# Patient Record
Sex: Female | Born: 1935 | Race: White | Hispanic: No | Marital: Married | Smoking: Never smoker
Health system: Southern US, Community
[De-identification: ages and names within clinical notes are randomized; demographics above are authoritative.]

## PROBLEM LIST (undated history)

## (undated) DIAGNOSIS — G8929 Other chronic pain: Secondary | ICD-10-CM

## (undated) DIAGNOSIS — G309 Alzheimer's disease, unspecified: Secondary | ICD-10-CM

## (undated) DIAGNOSIS — M549 Dorsalgia, unspecified: Secondary | ICD-10-CM

## (undated) DIAGNOSIS — F0280 Dementia in other diseases classified elsewhere without behavioral disturbance: Secondary | ICD-10-CM

## (undated) HISTORY — PX: ABDOMINAL HYSTERECTOMY: SHX81

## (undated) HISTORY — PX: CHOLECYSTECTOMY: SHX55

---

## 1998-08-02 ENCOUNTER — Encounter: Payer: Self-pay | Admitting: Emergency Medicine

## 1998-08-02 ENCOUNTER — Emergency Department (HOSPITAL_COMMUNITY): Admission: EM | Admit: 1998-08-02 | Discharge: 1998-08-02 | Payer: Self-pay | Admitting: Emergency Medicine

## 2016-02-02 DIAGNOSIS — E785 Hyperlipidemia, unspecified: Secondary | ICD-10-CM | POA: Diagnosis present

## 2017-08-16 ENCOUNTER — Emergency Department (HOSPITAL_BASED_OUTPATIENT_CLINIC_OR_DEPARTMENT_OTHER)
Admission: EM | Admit: 2017-08-16 | Discharge: 2017-08-16 | Disposition: A | Discharge status: Home / Self Care | Payer: Medicare Other | Attending: Emergency Medicine | Admitting: Emergency Medicine

## 2017-08-16 ENCOUNTER — Encounter (HOSPITAL_BASED_OUTPATIENT_CLINIC_OR_DEPARTMENT_OTHER): Payer: Self-pay | Admitting: Emergency Medicine

## 2017-08-16 ENCOUNTER — Other Ambulatory Visit: Payer: Self-pay

## 2017-08-16 ENCOUNTER — Emergency Department (HOSPITAL_BASED_OUTPATIENT_CLINIC_OR_DEPARTMENT_OTHER): Payer: Medicare Other

## 2017-08-16 DIAGNOSIS — G309 Alzheimer's disease, unspecified: Secondary | ICD-10-CM | POA: Insufficient documentation

## 2017-08-16 DIAGNOSIS — F0281 Dementia in other diseases classified elsewhere with behavioral disturbance: Secondary | ICD-10-CM | POA: Diagnosis not present

## 2017-08-16 DIAGNOSIS — R4182 Altered mental status, unspecified: Secondary | ICD-10-CM | POA: Diagnosis present

## 2017-08-16 HISTORY — DX: Dementia in other diseases classified elsewhere, unspecified severity, without behavioral disturbance, psychotic disturbance, mood disturbance, and anxiety: F02.80

## 2017-08-16 HISTORY — DX: Dorsalgia, unspecified: M54.9

## 2017-08-16 HISTORY — DX: Other chronic pain: G89.29

## 2017-08-16 HISTORY — DX: Alzheimer's disease, unspecified: G30.9

## 2017-08-16 LAB — URINALYSIS, ROUTINE W REFLEX MICROSCOPIC
Bilirubin Urine: NEGATIVE
Glucose, UA: NEGATIVE mg/dL
Hgb urine dipstick: NEGATIVE
Ketones, ur: NEGATIVE mg/dL
Nitrite: NEGATIVE
Protein, ur: NEGATIVE mg/dL
Specific Gravity, Urine: 1.015 (ref 1.005–1.030)
pH: 7 (ref 5.0–8.0)

## 2017-08-16 LAB — CBC WITH DIFFERENTIAL/PLATELET
Basophils Absolute: 0 10*3/uL (ref 0.0–0.1)
Basophils Relative: 0 %
Eosinophils Absolute: 0.1 10*3/uL (ref 0.0–0.7)
Eosinophils Relative: 1 %
HCT: 38.8 % (ref 36.0–46.0)
Hemoglobin: 13 g/dL (ref 12.0–15.0)
Lymphocytes Relative: 32 %
Lymphs Abs: 3.2 10*3/uL (ref 0.7–4.0)
MCH: 31 pg (ref 26.0–34.0)
MCHC: 33.5 g/dL (ref 30.0–36.0)
MCV: 92.4 fL (ref 78.0–100.0)
Monocytes Absolute: 0.9 10*3/uL (ref 0.1–1.0)
Monocytes Relative: 9 %
Neutro Abs: 6 10*3/uL (ref 1.7–7.7)
Neutrophils Relative %: 58 %
Platelets: 231 10*3/uL (ref 150–400)
RBC: 4.2 MIL/uL (ref 3.87–5.11)
RDW: 12 % (ref 11.5–15.5)
WBC: 10.3 10*3/uL (ref 4.0–10.5)

## 2017-08-16 LAB — TROPONIN I: Troponin I: <0.03 ng/mL (ref <0.03)

## 2017-08-16 LAB — URINALYSIS, MICROSCOPIC (REFLEX)

## 2017-08-16 LAB — COMPREHENSIVE METABOLIC PANEL
ALT: 14 U/L (ref 14–54)
AST: 18 U/L (ref 15–41)
Albumin: 3.6 g/dL (ref 3.5–5.0)
Alkaline Phosphatase: 93 U/L (ref 38–126)
Anion gap: 10 (ref 5–15)
BUN: 18 mg/dL (ref 6–20)
CO2: 26 mmol/L (ref 22–32)
Calcium: 8.9 mg/dL (ref 8.9–10.3)
Chloride: 103 mmol/L (ref 101–111)
Creatinine, Ser: 0.57 mg/dL (ref 0.44–1.00)
GFR calc Af Amer: >60 mL/min (ref >60)
GFR calc non Af Amer: >60 mL/min (ref >60)
Glucose, Bld: 95 mg/dL (ref 65–99)
Potassium: 3.5 mmol/L (ref 3.5–5.1)
Sodium: 139 mmol/L (ref 135–145)
Total Bilirubin: 0.4 mg/dL (ref 0.3–1.2)
Total Protein: 6.9 g/dL (ref 6.5–8.1)

## 2017-08-16 NOTE — ED Triage Notes (Signed)
Patient is brought in by family because she is a resident of [facility] and the patient has been becoming more and more confused today. The daughter states that she had a urine checked this am but [facility] wanted the patient to be evaluated further for a UTI vs progression of dementia

## 2017-08-16 NOTE — ED Provider Notes (Signed)
[facility] EMERGENCY DEPARTMENT Provider Note   CSN: [id number] Arrival date & time: 08/16/17  1935     History   Chief Complaint Chief Complaint  Patient presents with  . Altered Mental Status    HPI Melinda Love is a 84 y.o. female.  Patient is a 84 year old female with a history of dementia but no other significant medical problems.  Presenting today with family member for 2 weeks of worsening behavior.  Family member states until 2 weeks ago she had been doing really well she did in the past have issues with being paranoid but she had been doing well but in the last 2 weeks she has called the police multiple times on her daughter stating that she stole her car and her money.  She has been extremely agitated and paranoid.  After a week of this behavior she saw her psychiatrist and they started her on Depakote and Ativan which she has been taking almost daily as they are as needed meds.  She is still sleeping at night without any difficulty.  She is eating, drinking and denies chest pain, shortness of breath or abdominal pain.  Staff noticed may be some slight weakness when attempting to stand and seemed a little bit slower with walking but patient denies any fatigue or pain.  tHey did a urine today at her skilled facility however they would not get results back till Monday and they did not want her to wait all weekend.  Patient has no complaints when asked   The history is provided by a relative.  Altered Mental Status   This is a new problem. Episode onset: 2 weeks. The problem has not changed since onset.Associated symptoms include agitation. Her past medical history is significant for dementia.    Past Medical History:  Diagnosis Date  . Alzheimer disease   . Chronic back pain     There are no active problems to display for this patient.   Past Surgical History:  Procedure Laterality Date  . ABDOMINAL HYSTERECTOMY    . CHOLECYSTECTOMY      OB History    No  data available       Home Medications    Prior to Admission medications   Not on File    Family History History reviewed. No pertinent family history.  Social History Social History   Tobacco Use  . Smoking status: Never Smoker  . Smokeless tobacco: Never Used  Substance Use Topics  . Alcohol use: No    Frequency: Never  . Drug use: No     Allergies   Patient has no known allergies.   Review of Systems Review of Systems  Psychiatric/Behavioral: Positive for agitation.  All other systems reviewed and are negative.    Physical Exam Updated Vital Signs BP (!) 135/59 (BP Location: Left Arm)   Pulse 62   Temp 98.6 F (37 C) (Oral)   Resp 18   Wt 63.1 kg (139 lb 3.2 oz)   SpO2 98%   Physical Exam  Constitutional: She appears well-developed and well-nourished. No distress.  HENT:  Head: Normocephalic and atraumatic.  Mouth/Throat: Oropharynx is clear and moist.  Eyes: Conjunctivae and EOM are normal. Pupils are equal, round, and reactive to light.  Neck: Normal range of motion. Neck supple.  Cardiovascular: Normal rate, regular rhythm and intact distal pulses.  No murmur heard. Pulmonary/Chest: Effort normal and breath sounds normal. No respiratory distress. She has no wheezes. She has no rales.  Abdominal:  Soft. She exhibits no distension. There is no tenderness. There is no rebound and no guarding.  Musculoskeletal: Normal range of motion. She exhibits no edema or tenderness.  Neurological: She is alert.  Oriented to person and place.  Pt intermittently rubbing hands together but will pay attention and answer questions.  Gait wnl.  Normal strength, sensation and CN.  No aphasia  Skin: Skin is warm and dry. No rash noted. No erythema.  Psychiatric:  Patient is calm and cooperative on exam.  She does not appear to be having any hallucinations.  Currently is not displaying any paranoid behavior however based on daughter's report has problems with this.    Nursing note and vitals reviewed.    ED Treatments / Results  Labs (all labs ordered are listed, but only abnormal results are displayed) Labs Reviewed  URINALYSIS, ROUTINE W REFLEX MICROSCOPIC - Abnormal; Notable for the following components:      Result Value   APPearance CLOUDY (*)    Leukocytes, UA SMALL (*)    All other components within normal limits  URINALYSIS, MICROSCOPIC (REFLEX) - Abnormal; Notable for the following components:   Bacteria, UA MANY (*)    Squamous Epithelial / LPF 6-30 (*)    All other components within normal limits  CBC WITH DIFFERENTIAL/PLATELET  COMPREHENSIVE METABOLIC PANEL  TROPONIN I    EKG  EKG Interpretation  Date/Time:  Friday August 16 2017 21:17:11 EDT Ventricular Rate:  55 PR Interval:    QRS Duration: 137 QT Interval:  502 QTC Calculation: 481 R Axis:   99 Text Interpretation:  Sinus rhythm Nonspecific intraventricular conduction delay Probable anteroseptal infarct, recent No previous tracing Confirmed by Gwyneth SproutPlunkett, Juana Montini ([id number]) on 08/16/2017 10:14:00 PM       Radiology Ct Head Wo Contrast  Result Date: 08/16/2017 CLINICAL DATA:  Altered mental status. Alzheimer's dementia. No reported injury. EXAM: CT HEAD WITHOUT CONTRAST TECHNIQUE: Contiguous axial images were obtained from the base of the skull through the vertex without intravenous contrast. COMPARISON:  None. FINDINGS: Brain: No evidence of parenchymal hemorrhage or extra-axial fluid collection. No mass lesion, mass effect, or midline shift. No CT evidence of acute infarction. Nonspecific moderate subcortical and periventricular white matter hypodensity, most in keeping with chronic small vessel ischemic change. Generalized cerebral volume loss. No ventriculomegaly. Vascular: No acute abnormality. Skull: No evidence of calvarial fracture. Sinuses/Orbits: The visualized paranasal sinuses are essentially clear. Other:  Small partial bilateral inferior mastoid effusions.  IMPRESSION: 1.  No evidence of acute intracranial abnormality. 2. Generalized cerebral volume loss and moderate chronic small vessel ischemic changes in the cerebral white matter. 3. Nonspecific small partial bilateral mastoid effusions. Electronically Signed   By: Delbert PhenixJason A Poff M.D.   On: 08/16/2017 21:59    Procedures Procedures (including critical care time)  Medications Ordered in ED Medications - No data to display   Initial Impression / Assessment and Plan / ED Course  I have reviewed the triage vital signs and the nursing notes.  Pertinent labs & imaging results that were available during my care of the patient were reviewed by me and considered in my medical decision making (see chart for details).     Elderly female presenting today with change in mental status for the last 2 weeks.  Family initially thought this is just exacerbation of her dementia she saw her psychiatrist and had Depakote and Ativan started.  However patient's symptoms are not getting better and they started to look for a medical cause for her  symptoms.  Patient is in no acute distress on exam.  She has no complaints.  Vital signs are reassuring.  Will ensure no electrolyte abnormality, silent MI, or renal problems.  We will also look for UTI.  Also will ensure no acute intracranial process.  Patient had no medication changes prior to her symptoms. CBC, CMP, troponin, EKG, UA, head CT pending.  10:36 PM All labs are reassuring.  UA without obvious UTI.  Head CT and EKG without acute findings.  At this time feel more likely this is exacerbation of her dementia.  They will follow-up with her psychiatrist on Monday if she continues to have the symptoms for adjustment in her Depakote level.  Final Clinical Impressions(s) / ED Diagnoses   Final diagnoses:  Alzheimer's dementia with behavioral disturbance, unspecified timing of dementia onset    ED Discharge Orders    None       Gwyneth Sprout, MD 08/16/17  2237

## 2017-10-07 ENCOUNTER — Emergency Department (HOSPITAL_BASED_OUTPATIENT_CLINIC_OR_DEPARTMENT_OTHER): Payer: Medicare Other

## 2017-10-07 ENCOUNTER — Other Ambulatory Visit: Payer: Self-pay

## 2017-10-07 ENCOUNTER — Emergency Department (HOSPITAL_BASED_OUTPATIENT_CLINIC_OR_DEPARTMENT_OTHER)
Admission: EM | Admit: 2017-10-07 | Discharge: 2017-10-07 | Disposition: A | Discharge status: Home / Self Care | Payer: Medicare Other | Attending: Emergency Medicine | Admitting: Emergency Medicine

## 2017-10-07 ENCOUNTER — Encounter (HOSPITAL_BASED_OUTPATIENT_CLINIC_OR_DEPARTMENT_OTHER): Payer: Self-pay | Admitting: *Deleted

## 2017-10-07 DIAGNOSIS — R05 Cough: Secondary | ICD-10-CM | POA: Insufficient documentation

## 2017-10-07 DIAGNOSIS — R059 Cough, unspecified: Secondary | ICD-10-CM

## 2017-10-07 DIAGNOSIS — G309 Alzheimer's disease, unspecified: Secondary | ICD-10-CM | POA: Insufficient documentation

## 2017-10-07 MED ORDER — GUAIFENESIN 100 MG/5ML PO SYRP: 100.0000 mg | ORAL_SOLUTION | ORAL | 0 refills | Status: DC | PRN

## 2017-10-07 NOTE — ED Provider Notes (Signed)
[facility] EMERGENCY DEPARTMENT Provider Note   CSN: [id number] Arrival date & time: 10/07/17  1816     History   Chief Complaint Chief Complaint  Patient presents with  . Cough  . Chills    HPI Melinda Love is a 84 y.o. female.  HPI  84 year old female with a history of Alzheimer's presents with cough.  History is taken from patient and daughter.  Daughter states the facility told her the cough started today around noon.  Patient states she had a cough yesterday that was milder as well as low-grade fever of 99.  However both deny that she is had fevers over 100, vomiting, shortness of breath, or pain such as chest pain.  The cough is nonproductive.  Past Medical History:  Diagnosis Date  . Alzheimer disease   . Chronic back pain     There are no active problems to display for this patient.   Past Surgical History:  Procedure Laterality Date  . ABDOMINAL HYSTERECTOMY    . CHOLECYSTECTOMY       OB History   None      Home Medications    Prior to Admission medications   Medication Sig Start Date End Date Taking? Authorizing Provider  guaifenesin (ROBITUSSIN) 100 MG/5ML syrup Take 5-10 mLs (100-200 mg total) by mouth every 4 (four) hours as needed for cough. 10/07/17   Pricilla Loveless, MD    Family History No family history on file.  Social History Social History   Tobacco Use  . Smoking status: Never Smoker  . Smokeless tobacco: Never Used  Substance Use Topics  . Alcohol use: No    Frequency: Never  . Drug use: No     Allergies   Patient has no known allergies.   Review of Systems Review of Systems  Constitutional: Negative for fever.  Respiratory: Positive for cough. Negative for shortness of breath.   Cardiovascular: Negative for chest pain.  Gastrointestinal: Negative for vomiting.     Physical Exam Updated Vital Signs BP (!) 159/66   Pulse 74   Temp 98.5 F (36.9 C)   Resp 20   Ht  (1.651 m)   Wt 63 kg (139 lb)    SpO2 95%   BMI 23.13 kg/m   Physical Exam  Constitutional: She appears well-developed and well-nourished. No distress.  HENT:  Head: Normocephalic and atraumatic.  Right Ear: External ear normal.  Left Ear: External ear normal.  Nose: Nose normal.  Eyes: Right eye exhibits no discharge. Left eye exhibits no discharge.  Cardiovascular: Normal rate, regular rhythm and normal heart sounds.  Pulmonary/Chest: Effort normal and breath sounds normal. No respiratory distress. She has no rales.  Abdominal: Soft. She exhibits no distension. There is no tenderness.  Neurological: She is alert.  Skin: Skin is warm and dry. She is not diaphoretic.  Nursing note and vitals reviewed.    ED Treatments / Results  Labs (all labs ordered are listed, but only abnormal results are displayed) Labs Reviewed - No data to display  EKG None  Radiology Dg Chest 2 View  Result Date: 10/07/2017 CLINICAL DATA:  Pt c/o cough, fever, and chills since earlier today. EXAM: CHEST - 2 VIEW COMPARISON:  None. FINDINGS: Cardiac silhouette is normal in size. No mediastinal or hilar masses. No evidence of adenopathy. There are reticular opacities in the anterior lung bases consistent with scarring. Lungs are otherwise clear. No pleural effusion or pneumothorax. Skeletal structures are demineralized. A tack in the  right humeral head is consistent with prior rotator cuff surgery. IMPRESSION: No acute cardiopulmonary disease. Electronically Signed   By: Amie Portland M.D.   On: 10/07/2017 21:50    Procedures Procedures (including critical care time)  Medications Ordered in ED Medications - No data to display   Initial Impression / Assessment and Plan / ED Course  I have reviewed the triage vital signs and the nursing notes.  Pertinent labs & imaging results that were available during my care of the patient were reviewed by me and considered in my medical decision making (see chart for details).     Patient's  chest x-ray is benign.  She has intermittent scattered wheezes that resolve on their own with repeated inspiration.  No obvious pneumonia.  Given she is afebrile, not short of breath, no hypoxia, I doubt pneumonia and this is very early.  This could develop into pneumonia and daughter was made aware of this.  However at this point I would treat cough and just have other generalized supportive care.  She appears stable for discharge back to her facility with return precautions.  Final Clinical Impressions(s) / ED Diagnoses   Final diagnoses:  Cough    ED Discharge Orders        Ordered    guaifenesin (ROBITUSSIN) 100 MG/5ML syrup  Every 4 hours PRN     10/07/17 2256       Pricilla Loveless, MD 10/07/17 2341

## 2017-10-07 NOTE — ED Notes (Signed)
ED Provider at bedside. 

## 2017-10-07 NOTE — ED Triage Notes (Signed)
Cough, low grade fever and chills. She lives at [facility]. Daughter states she had an update today at 12pm and she was doing great.

## 2017-10-07 NOTE — Discharge Instructions (Signed)
See your doctor or return to the ER if you develop worsening cough such as having sputum or if you have fever, shortness of breath, chest pain, or other new/concerning symptoms.

## 2019-11-22 ENCOUNTER — Encounter (HOSPITAL_BASED_OUTPATIENT_CLINIC_OR_DEPARTMENT_OTHER): Payer: Self-pay | Admitting: Emergency Medicine

## 2019-11-22 ENCOUNTER — Other Ambulatory Visit: Payer: Self-pay

## 2019-11-22 ENCOUNTER — Emergency Department (HOSPITAL_BASED_OUTPATIENT_CLINIC_OR_DEPARTMENT_OTHER): Payer: Medicare Other

## 2019-11-22 ENCOUNTER — Emergency Department (HOSPITAL_BASED_OUTPATIENT_CLINIC_OR_DEPARTMENT_OTHER)
Admission: EM | Admit: 2019-11-22 | Discharge: 2019-11-22 | Disposition: A | Discharge status: Home / Self Care | Payer: Medicare Other | Attending: Emergency Medicine | Admitting: Emergency Medicine

## 2019-11-22 DIAGNOSIS — Y92128 Other place in nursing home as the place of occurrence of the external cause: Secondary | ICD-10-CM | POA: Insufficient documentation

## 2019-11-22 DIAGNOSIS — F0390 Unspecified dementia without behavioral disturbance: Secondary | ICD-10-CM | POA: Diagnosis not present

## 2019-11-22 DIAGNOSIS — W19XXXA Unspecified fall, initial encounter: Secondary | ICD-10-CM

## 2019-11-22 DIAGNOSIS — M25561 Pain in right knee: Secondary | ICD-10-CM | POA: Insufficient documentation

## 2019-11-22 DIAGNOSIS — S60221A Contusion of right hand, initial encounter: Secondary | ICD-10-CM | POA: Insufficient documentation

## 2019-11-22 DIAGNOSIS — W1839XA Other fall on same level, initial encounter: Secondary | ICD-10-CM | POA: Insufficient documentation

## 2019-11-22 DIAGNOSIS — Y999 Unspecified external cause status: Secondary | ICD-10-CM | POA: Insufficient documentation

## 2019-11-22 DIAGNOSIS — S60222A Contusion of left hand, initial encounter: Secondary | ICD-10-CM | POA: Insufficient documentation

## 2019-11-22 DIAGNOSIS — Y9389 Activity, other specified: Secondary | ICD-10-CM | POA: Diagnosis not present

## 2019-11-22 DIAGNOSIS — S0003XA Contusion of scalp, initial encounter: Secondary | ICD-10-CM | POA: Diagnosis present

## 2019-11-22 LAB — CBG MONITORING, ED: Glucose-Capillary: 113 mg/dL — ABNORMAL HIGH (ref 70–99)

## 2019-11-22 MED ORDER — SODIUM CHLORIDE 0.9 % IV SOLN: 1.0000 g | Freq: Once | INTRAVENOUS | Status: DC

## 2019-11-22 MED ORDER — ACETAMINOPHEN 325 MG PO TABS
650.0000 mg | ORAL_TABLET | Freq: Once | ORAL | Status: AC
  Administered 2019-11-22: 650 mg via ORAL
  Filled 2019-11-22: qty 2

## 2019-11-22 NOTE — ED Provider Notes (Signed)
[facility] EMERGENCY DEPARTMENT Provider Note   CSN: [id number] Arrival date & time: 11/22/19  1851  LEVEL 5 CAVEAT - DEMENTIA  History Chief Complaint  Patient presents with  . Fall    Melinda Love is a 86 y.o. female.  HPI 86 year old female presents after a fall at her nursing facility.  Has an occipital hematoma.  Per EMS she fell twice.  The patient was complaining of bilateral hip pain at the facility.  Otherwise has a history of frequent falls.  Further history is limited by her dementia.  Past Medical History:  Diagnosis Date  . Alzheimer disease (HCC)   . Chronic back pain     There are no problems to display for this patient.   Past Surgical History:  Procedure Laterality Date  . ABDOMINAL HYSTERECTOMY    . CHOLECYSTECTOMY       OB History   No obstetric history on file.     No family history on file.  Social History   Tobacco Use  . Smoking status: Never Smoker  . Smokeless tobacco: Never Used  Substance Use Topics  . Alcohol use: No  . Drug use: No    Home Medications Prior to Admission medications   Medication Sig Start Date End Date Taking? Authorizing Provider  acetaminophen (TYLENOL) 325 MG tablet Take 650 mg by mouth every 6 (six) hours as needed.   Yes [provider]  busPIRone (BUSPAR) 10 MG tablet Take 10 mg by mouth 3 (three) times daily.   Yes [provider]  carbamide peroxide (DEBROX) 6.5 % OTIC solution Place 3 drops into both ears at bedtime.   Yes [provider]  cholecalciferol (VITAMIN D3) 10 MCG (400 UNIT) TABS tablet Take 500 Units by mouth.   Yes [provider]  donepezil (ARICEPT) 10 MG tablet Take 10 mg by mouth at bedtime.   Yes [provider]  furosemide (LASIX) 20 MG tablet Take 20 mg by mouth.   Yes [provider]  hydroxypropyl methylcellulose / hypromellose (ISOPTO TEARS / GONIOVISC) 2.5 % ophthalmic solution Place 2 drops into both eyes.   Yes  [provider]  LORazepam (ATIVAN) 0.5 MG tablet Take 0.5 mg by mouth 2 (two) times daily.   Yes [provider]  melatonin 1 MG TABS tablet Take 3 mg by mouth at bedtime.   Yes [provider]  memantine (NAMENDA) 10 MG tablet Take 10 mg by mouth daily.   Yes [provider]  risperiDONE (RISPERDAL) 0.5 MG tablet Take 0.75 mg by mouth 2 (two) times daily.   Yes [provider]  sertraline (ZOLOFT) 100 MG tablet Take 125 mg by mouth daily.   Yes [provider]  Skin Protectants, Misc. (EUCERIN) cream Apply topically every evening.   Yes [provider]  traZODone (DESYREL) 50 MG tablet Take 50 mg by mouth at bedtime.   Yes [provider]  vitamin B-12 (CYANOCOBALAMIN) 500 MCG tablet Take 500 mcg by mouth daily.   Yes [provider]  guaifenesin (ROBITUSSIN) 100 MG/5ML syrup Take 5-10 mLs (100-200 mg total) by mouth every 4 (four) hours as needed for cough. 10/07/17   Pricilla Loveless, MD    Allergies    Patient has no known allergies.  Review of Systems   Review of Systems  Unable to perform ROS: Dementia    Physical Exam Updated Vital Signs BP (!) 163/60 (BP Location: Right Arm)   Pulse 76   Temp 98  F (36.7 C) (Oral)   Resp 16   Ht 5\' 5"  (1.651 m)   Wt 75.7 kg   SpO2 93%   BMI 27.77 kg/m   Physical Exam Vitals and nursing note reviewed.  Constitutional:      General: She is not in acute distress.    Appearance: She is well-developed. She is not ill-appearing or diaphoretic.     Interventions: Cervical collar in place.  HENT:     Head: Normocephalic and atraumatic.     Right Ear: External ear normal.     Left Ear: External ear normal.     Nose: Nose normal.  Eyes:     General:        Right eye: No discharge.        Left eye: No discharge.  Cardiovascular:     Rate and Rhythm: Normal rate and regular rhythm.     Pulses:          Dorsalis pedis pulses are 2+ on the right side and 2+ on  the left side.     Heart sounds: Normal heart sounds.  Pulmonary:     Effort: Pulmonary effort is normal.     Breath sounds: Normal breath sounds.  Abdominal:     Palpations: Abdomen is soft.     Tenderness: There is no abdominal tenderness.  Musculoskeletal:     Right wrist: No swelling, deformity or tenderness. Normal range of motion.     Left wrist: No swelling, deformity or tenderness. Normal range of motion.     Right hand: No tenderness.     Left hand: No tenderness.     Cervical back: No tenderness.     Thoracic back: No tenderness.     Lumbar back: No tenderness.       Back:     Right hip: No deformity or tenderness. Normal range of motion.     Left hip: No deformity or tenderness. Normal range of motion.     Right knee: Tenderness present.     Left knee: No tenderness.     Comments: Mild bilateral thenar bruises to hands. Do not appear tender Mild pain with ROM of right knee, though no tenderness or swelling.  Skin:    General: Skin is warm and dry.  Neurological:     Mental Status: She is alert.     Comments: Awake, alert, moves all 4 extremities purposefully and appears to have equal strength.  Psychiatric:        Mood and Affect: Mood is not anxious.     ED Results / Procedures / Treatments   Labs (all labs ordered are listed, but only abnormal results are displayed) Labs Reviewed  CBG MONITORING, ED - Abnormal; Notable for the following components:      Result Value   Glucose-Capillary 113 (*)    All other components within normal limits    EKG EKG Interpretation  Date/Time:  Sunday November 22 2019 19:21:29 EDT Ventricular Rate:  82 PR Interval:    QRS Duration: 137 QT Interval:  399 QTC Calculation: 466 R Axis:   62 Text Interpretation: Sinus rhythm IVCD, consider atypical LBBB Poor data quality in current ECG precludes serial comparison otherwise seems similar to 2019 Confirmed by Sherwood Gambler [phone]) on 11/22/2019 7:25:41 PM   Radiology DG  Chest 1 View  Result Date: 11/22/2019 CLINICAL DATA:  Fall with chest pain. EXAM: CHEST  1 VIEW COMPARISON:  Chest radiograph dated 10/07/2017. FINDINGS: The heart size and  mediastinal contours are within normal limits. Vascular calcifications are seen in the aortic arch. Both lungs are clear. The visualized skeletal structures are unremarkable. IMPRESSION: No active disease. Electronically Signed   By: Romona Curls M.D.   On: 11/22/2019 20:10   CT Head Wo Contrast  Result Date: 11/22/2019 CLINICAL DATA:  Fall with head and neck pain. EXAM: CT HEAD WITHOUT CONTRAST CT CERVICAL SPINE WITHOUT CONTRAST TECHNIQUE: Multidetector CT imaging of the head and cervical spine was performed following the standard protocol without intravenous contrast. Multiplanar CT image reconstructions of the cervical spine were also generated. COMPARISON:  Head CT dated 08/16/2017. FINDINGS: CT HEAD FINDINGS Brain: No evidence of acute infarction, hemorrhage, hydrocephalus, extra-axial collection or mass lesion/mass effect. There is mild cerebral volume loss with associated ex vacuo dilatation. Periventricular white matter hypoattenuation likely represents chronic small vessel ischemic disease. Vascular: There are vascular calcifications in the carotid siphons. Skull: Normal. Negative for fracture or focal lesion. Sinuses/Orbits: There are small bilateral mastoid effusions. Other: None. CT CERVICAL SPINE FINDINGS Alignment: Normal. Skull base and vertebrae: No acute fracture. No primary bone lesion or focal pathologic process. Soft tissues and spinal canal: No prevertebral fluid or swelling. No visible canal hematoma. Disc levels: Up to moderate multilevel degenerative disc and joint disease. Upper chest: Negative. Other: None. IMPRESSION: 1. No acute intracranial process. 2. No acute osseous injury in the cervical spine. Electronically Signed   By: Romona Curls M.D.   On: 11/22/2019 19:54   CT Cervical Spine Wo Contrast  Result  Date: 11/22/2019 CLINICAL DATA:  Fall with head and neck pain. EXAM: CT HEAD WITHOUT CONTRAST CT CERVICAL SPINE WITHOUT CONTRAST TECHNIQUE: Multidetector CT imaging of the head and cervical spine was performed following the standard protocol without intravenous contrast. Multiplanar CT image reconstructions of the cervical spine were also generated. COMPARISON:  Head CT dated 08/16/2017. FINDINGS: CT HEAD FINDINGS Brain: No evidence of acute infarction, hemorrhage, hydrocephalus, extra-axial collection or mass lesion/mass effect. There is mild cerebral volume loss with associated ex vacuo dilatation. Periventricular white matter hypoattenuation likely represents chronic small vessel ischemic disease. Vascular: There are vascular calcifications in the carotid siphons. Skull: Normal. Negative for fracture or focal lesion. Sinuses/Orbits: There are small bilateral mastoid effusions. Other: None. CT CERVICAL SPINE FINDINGS Alignment: Normal. Skull base and vertebrae: No acute fracture. No primary bone lesion or focal pathologic process. Soft tissues and spinal canal: No prevertebral fluid or swelling. No visible canal hematoma. Disc levels: Up to moderate multilevel degenerative disc and joint disease. Upper chest: Negative. Other: None. IMPRESSION: 1. No acute intracranial process. 2. No acute osseous injury in the cervical spine. Electronically Signed   By: Romona Curls M.D.   On: 11/22/2019 19:54   DG Knee Complete 4 Views Right  Result Date: 11/22/2019 CLINICAL DATA:  Fall with knee pain. EXAM: RIGHT KNEE - COMPLETE 4+ VIEW COMPARISON:  None. FINDINGS: No evidence of fracture, dislocation, or joint effusion. No evidence of arthropathy or other focal bone abnormality. Soft tissues are unremarkable. IMPRESSION: Negative. Electronically Signed   By: Romona Curls M.D.   On: 11/22/2019 20:11   DG Hand Complete Left  Result Date: 11/22/2019 CLINICAL DATA:  Fall with bruising on the hands. EXAM: LEFT HAND -  COMPLETE 3+ VIEW COMPARISON:  None. FINDINGS: There is no evidence of fracture or dislocation. Degenerative changes are most significant in the distal interphalangeal joints. There is diffuse osseous demineralization. Soft tissues are unremarkable. IMPRESSION: No acute osseous injury. Electronically Signed  By: Romona Curls M.D.   On: 11/22/2019 20:14   DG Hand Complete Right  Result Date: 11/22/2019 CLINICAL DATA:  Fall with bruising on the hands. EXAM: RIGHT HAND - COMPLETE 3+ VIEW COMPARISON:  None. FINDINGS: There is no evidence of fracture or dislocation. Degenerative changes are most significant in the distal interphalangeal joints. There is diffuse osseous demineralization. Soft tissues are unremarkable. IMPRESSION: No acute osseous injury. Electronically Signed   By: Romona Curls M.D.   On: 11/22/2019 20:14   DG Hips Bilat W or Wo Pelvis 2 Views  Result Date: 11/22/2019 CLINICAL DATA:  Fall from standing with pain. EXAM: DG HIP (WITH OR WITHOUT PELVIS) 2V BILAT COMPARISON:  None. FINDINGS: There is no evidence of hip fracture or dislocation. Degenerative changes are seen in the lumbar spine and both hips. IMPRESSION: No acute osseous injury. Electronically Signed   By: Romona Curls M.D.   On: 11/22/2019 20:11    Procedures Procedures (including critical care time)  Medications Ordered in ED Medications  acetaminophen (TYLENOL) tablet 650 mg (650 mg Oral Given 11/22/19 1915)    ED Course  I have reviewed the triage vital signs and the nursing notes.  Pertinent labs & imaging results that were available during my care of the patient were reviewed by me and considered in my medical decision making (see chart for details).    MDM Rules/Calculators/A&P                          I discussed patient with the nursing home nurse who takes care of her and the patient is chronically unsteady on her feet.  She often tries to get up without assistance but she needs multiple people to be able  to stand without falling.  She has not been ill recently.  X-rays, CBG, CT and ECG all personally reviewed.  Unremarkable overall.  She was able to stand and bear weight and has no hip pain or tenderness so my suspicion of an occult fracture is quite low.  Stable for discharge back to her facility. Final Clinical Impression(s) / ED Diagnoses Final diagnoses:  Fall, initial encounter  Hematoma of scalp, initial encounter    Rx / DC Orders ED Discharge Orders    None       Pricilla Loveless, MD 11/22/19 2050

## 2019-11-22 NOTE — ED Notes (Signed)
Able to stand with assistance and bear weight.

## 2019-11-22 NOTE — ED Notes (Signed)
Contacted [facility] - patient ready for transport to [facility] @ [address] [place], [place]

## 2019-11-22 NOTE — ED Notes (Signed)
Incontinence care provided.  Leaving with [facility] at this time.  Report given to [facility].

## 2019-11-22 NOTE — ED Triage Notes (Signed)
Brought by ems from [facility] after having a mechanical fall.  C/O bil hip pain and hematoma noted to back of head.  Not on blood thinners.  No LOC reported.  Patient does have hx of dementia with frequent falls.

## 2020-05-07 ENCOUNTER — Other Ambulatory Visit: Payer: Self-pay

## 2020-05-07 ENCOUNTER — Inpatient Hospital Stay (HOSPITAL_BASED_OUTPATIENT_CLINIC_OR_DEPARTMENT_OTHER)
Admission: EM | Admit: 2020-05-07 | Discharge: 2020-05-16 | DRG: 592 | Disposition: A | Discharge status: Other Acute Inpatient Hospital | Payer: Medicare Other | Attending: Internal Medicine | Admitting: Internal Medicine

## 2020-05-07 ENCOUNTER — Emergency Department (HOSPITAL_BASED_OUTPATIENT_CLINIC_OR_DEPARTMENT_OTHER): Payer: Medicare Other

## 2020-05-07 ENCOUNTER — Encounter (HOSPITAL_BASED_OUTPATIENT_CLINIC_OR_DEPARTMENT_OTHER): Payer: Self-pay

## 2020-05-07 DIAGNOSIS — Z7189 Other specified counseling: Secondary | ICD-10-CM | POA: Diagnosis not present

## 2020-05-07 DIAGNOSIS — Z993 Dependence on wheelchair: Secondary | ICD-10-CM | POA: Diagnosis not present

## 2020-05-07 DIAGNOSIS — B964 Proteus (mirabilis) (morganii) as the cause of diseases classified elsewhere: Secondary | ICD-10-CM | POA: Diagnosis present

## 2020-05-07 DIAGNOSIS — B9562 Methicillin resistant Staphylococcus aureus infection as the cause of diseases classified elsewhere: Secondary | ICD-10-CM | POA: Diagnosis present

## 2020-05-07 DIAGNOSIS — L89154 Pressure ulcer of sacral region, stage 4: Principal | ICD-10-CM | POA: Diagnosis present

## 2020-05-07 DIAGNOSIS — F32A Depression, unspecified: Secondary | ICD-10-CM | POA: Diagnosis present

## 2020-05-07 DIAGNOSIS — E876 Hypokalemia: Secondary | ICD-10-CM

## 2020-05-07 DIAGNOSIS — E639 Nutritional deficiency, unspecified: Secondary | ICD-10-CM | POA: Diagnosis present

## 2020-05-07 DIAGNOSIS — Z90710 Acquired absence of both cervix and uterus: Secondary | ICD-10-CM

## 2020-05-07 DIAGNOSIS — D649 Anemia, unspecified: Secondary | ICD-10-CM | POA: Diagnosis not present

## 2020-05-07 DIAGNOSIS — R7989 Other specified abnormal findings of blood chemistry: Secondary | ICD-10-CM | POA: Diagnosis present

## 2020-05-07 DIAGNOSIS — D638 Anemia in other chronic diseases classified elsewhere: Secondary | ICD-10-CM | POA: Diagnosis present

## 2020-05-07 DIAGNOSIS — G928 Other toxic encephalopathy: Secondary | ICD-10-CM | POA: Diagnosis present

## 2020-05-07 DIAGNOSIS — Z66 Do not resuscitate: Secondary | ICD-10-CM | POA: Diagnosis not present

## 2020-05-07 DIAGNOSIS — M549 Dorsalgia, unspecified: Secondary | ICD-10-CM | POA: Diagnosis present

## 2020-05-07 DIAGNOSIS — Z7401 Bed confinement status: Secondary | ICD-10-CM | POA: Diagnosis not present

## 2020-05-07 DIAGNOSIS — L0390 Cellulitis, unspecified: Secondary | ICD-10-CM | POA: Diagnosis present

## 2020-05-07 DIAGNOSIS — F0281 Dementia in other diseases classified elsewhere with behavioral disturbance: Secondary | ICD-10-CM | POA: Diagnosis present

## 2020-05-07 DIAGNOSIS — T50915A Adverse effect of multiple unspecified drugs, medicaments and biological substances, initial encounter: Secondary | ICD-10-CM | POA: Diagnosis present

## 2020-05-07 DIAGNOSIS — N390 Urinary tract infection, site not specified: Secondary | ICD-10-CM | POA: Diagnosis present

## 2020-05-07 DIAGNOSIS — E785 Hyperlipidemia, unspecified: Secondary | ICD-10-CM | POA: Diagnosis present

## 2020-05-07 DIAGNOSIS — G9341 Metabolic encephalopathy: Secondary | ICD-10-CM | POA: Diagnosis present

## 2020-05-07 DIAGNOSIS — D72829 Elevated white blood cell count, unspecified: Secondary | ICD-10-CM

## 2020-05-07 DIAGNOSIS — Z20822 Contact with and (suspected) exposure to covid-19: Secondary | ICD-10-CM | POA: Diagnosis present

## 2020-05-07 DIAGNOSIS — B9620 Unspecified Escherichia coli [E. coli] as the cause of diseases classified elsewhere: Secondary | ICD-10-CM | POA: Diagnosis present

## 2020-05-07 DIAGNOSIS — G309 Alzheimer's disease, unspecified: Secondary | ICD-10-CM | POA: Diagnosis present

## 2020-05-07 DIAGNOSIS — L98429 Non-pressure chronic ulcer of back with unspecified severity: Secondary | ICD-10-CM | POA: Diagnosis present

## 2020-05-07 DIAGNOSIS — D509 Iron deficiency anemia, unspecified: Secondary | ICD-10-CM | POA: Diagnosis present

## 2020-05-07 DIAGNOSIS — Z79899 Other long term (current) drug therapy: Secondary | ICD-10-CM

## 2020-05-07 DIAGNOSIS — L03818 Cellulitis of other sites: Secondary | ICD-10-CM | POA: Diagnosis not present

## 2020-05-07 DIAGNOSIS — R4182 Altered mental status, unspecified: Secondary | ICD-10-CM

## 2020-05-07 DIAGNOSIS — Z515 Encounter for palliative care: Secondary | ICD-10-CM | POA: Diagnosis not present

## 2020-05-07 DIAGNOSIS — Z23 Encounter for immunization: Secondary | ICD-10-CM

## 2020-05-07 DIAGNOSIS — Z882 Allergy status to sulfonamides status: Secondary | ICD-10-CM

## 2020-05-07 DIAGNOSIS — E538 Deficiency of other specified B group vitamins: Secondary | ICD-10-CM | POA: Diagnosis present

## 2020-05-07 DIAGNOSIS — G8929 Other chronic pain: Secondary | ICD-10-CM | POA: Diagnosis present

## 2020-05-07 DIAGNOSIS — F0280 Dementia in other diseases classified elsewhere without behavioral disturbance: Secondary | ICD-10-CM

## 2020-05-07 LAB — URINALYSIS, ROUTINE W REFLEX MICROSCOPIC
Bilirubin Urine: NEGATIVE
Glucose, UA: NEGATIVE mg/dL
Hgb urine dipstick: NEGATIVE
Ketones, ur: NEGATIVE mg/dL
Nitrite: POSITIVE — AB
Protein, ur: NEGATIVE mg/dL
Specific Gravity, Urine: 1.01 (ref 1.005–1.030)
pH: 7 (ref 5.0–8.0)

## 2020-05-07 LAB — CBC WITH DIFFERENTIAL/PLATELET
Abs Immature Granulocytes: 0.07 10*3/uL (ref 0.00–0.07)
Basophils Absolute: 0.1 10*3/uL (ref 0.0–0.1)
Basophils Relative: 0 %
Eosinophils Absolute: 0.1 10*3/uL (ref 0.0–0.5)
Eosinophils Relative: 1 %
HCT: 34.4 % — ABNORMAL LOW (ref 36.0–46.0)
Hemoglobin: 11.2 g/dL — ABNORMAL LOW (ref 12.0–15.0)
Immature Granulocytes: 1 %
Lymphocytes Relative: 19 %
Lymphs Abs: 2.6 10*3/uL (ref 0.7–4.0)
MCH: 27.3 pg (ref 26.0–34.0)
MCHC: 32.6 g/dL (ref 30.0–36.0)
MCV: 83.7 fL (ref 80.0–100.0)
Monocytes Absolute: 0.8 10*3/uL (ref 0.1–1.0)
Monocytes Relative: 6 %
Neutro Abs: 9.7 10*3/uL — ABNORMAL HIGH (ref 1.7–7.7)
Neutrophils Relative %: 73 %
Platelets: 295 10*3/uL (ref 150–400)
RBC: 4.11 MIL/uL (ref 3.87–5.11)
RDW: 13.3 % (ref 11.5–15.5)
WBC: 13.3 10*3/uL — ABNORMAL HIGH (ref 4.0–10.5)
nRBC: 0 % (ref 0.0–0.2)

## 2020-05-07 LAB — URINALYSIS, MICROSCOPIC (REFLEX): RBC / HPF: NONE SEEN RBC/hpf (ref 0–5)

## 2020-05-07 LAB — COMPREHENSIVE METABOLIC PANEL
ALT: 12 U/L (ref 0–44)
AST: 15 U/L (ref 15–41)
Albumin: 2.6 g/dL — ABNORMAL LOW (ref 3.5–5.0)
Alkaline Phosphatase: 90 U/L (ref 38–126)
Anion gap: 11 (ref 5–15)
BUN: 12 mg/dL (ref 8–23)
CO2: 30 mmol/L (ref 22–32)
Calcium: 8.8 mg/dL — ABNORMAL LOW (ref 8.9–10.3)
Chloride: 96 mmol/L — ABNORMAL LOW (ref 98–111)
Creatinine, Ser: 0.6 mg/dL (ref 0.44–1.00)
GFR, Estimated: >60 mL/min (ref >60)
Glucose, Bld: 91 mg/dL (ref 70–99)
Potassium: 3 mmol/L — ABNORMAL LOW (ref 3.5–5.1)
Sodium: 137 mmol/L (ref 135–145)
Total Bilirubin: 0.2 mg/dL — ABNORMAL LOW (ref 0.3–1.2)
Total Protein: 6.6 g/dL (ref 6.5–8.1)

## 2020-05-07 LAB — RESP PANEL BY RT-PCR (FLU A&B, COVID) ARPGX2
Influenza A by PCR: NEGATIVE
Influenza B by PCR: NEGATIVE
SARS Coronavirus 2 by RT PCR: NEGATIVE

## 2020-05-07 LAB — LACTIC ACID, PLASMA: Lactic Acid, Venous: 1.5 mmol/L (ref 0.5–1.9)

## 2020-05-07 MED ORDER — ACETAMINOPHEN 325 MG PO TABS
650.0000 mg | ORAL_TABLET | Freq: Four times a day (QID) | ORAL | Status: DC | PRN
  Administered 2020-05-13 – 2020-05-14 (×2): 650 mg via ORAL
  Filled 2020-05-07 (×3): qty 2

## 2020-05-07 MED ORDER — VANCOMYCIN HCL IN DEXTROSE 1-5 GM/200ML-% IV SOLN
1000.0000 mg | Freq: Once | INTRAVENOUS | Status: AC
  Administered 2020-05-07: 1000 mg via INTRAVENOUS
  Filled 2020-05-07: qty 200

## 2020-05-07 MED ORDER — BUSPIRONE HCL 5 MG PO TABS
10.0000 mg | ORAL_TABLET | Freq: Three times a day (TID) | ORAL | Status: DC
  Administered 2020-05-08 (×2): 10 mg via ORAL
  Filled 2020-05-07: qty 2
  Filled 2020-05-07 (×2): qty 1
  Filled 2020-05-07: qty 2

## 2020-05-07 MED ORDER — TRAZODONE HCL 50 MG PO TABS
50.0000 mg | ORAL_TABLET | Freq: Every day | ORAL | Status: DC
  Administered 2020-05-08: 50 mg via ORAL
  Filled 2020-05-07: qty 1

## 2020-05-07 MED ORDER — LORAZEPAM 0.5 MG PO TABS
0.5000 mg | ORAL_TABLET | Freq: Two times a day (BID) | ORAL | Status: DC
  Administered 2020-05-08 (×2): 0.5 mg via ORAL
  Filled 2020-05-07 (×2): qty 1

## 2020-05-07 MED ORDER — MELATONIN 3 MG PO TABS
3.0000 mg | ORAL_TABLET | Freq: Every day | ORAL | Status: DC
  Administered 2020-05-08 – 2020-05-15 (×9): 3 mg via ORAL
  Filled 2020-05-07 (×11): qty 1

## 2020-05-07 MED ORDER — FUROSEMIDE 20 MG PO TABS
20.0000 mg | ORAL_TABLET | Freq: Every day | ORAL | Status: DC
  Administered 2020-05-08: 20 mg via ORAL
  Filled 2020-05-07: qty 1

## 2020-05-07 MED ORDER — LORAZEPAM 2 MG/ML IJ SOLN
0.5000 mg | Freq: Once | INTRAMUSCULAR | Status: AC
  Administered 2020-05-07: 0.5 mg via INTRAVENOUS
  Filled 2020-05-07: qty 1

## 2020-05-07 MED ORDER — LORAZEPAM 2 MG/ML IJ SOLN
1.0000 mg | Freq: Once | INTRAMUSCULAR | Status: AC
  Administered 2020-05-07: 1 mg via INTRAVENOUS
  Filled 2020-05-07: qty 1

## 2020-05-07 MED ORDER — RISPERIDONE 0.5 MG PO TABS
0.7500 mg | ORAL_TABLET | Freq: Two times a day (BID) | ORAL | Status: DC
  Administered 2020-05-08 (×2): 0.75 mg via ORAL
  Filled 2020-05-07 (×2): qty 1

## 2020-05-07 MED ORDER — SERTRALINE HCL 25 MG PO TABS
125.0000 mg | ORAL_TABLET | Freq: Every day | ORAL | Status: DC
  Administered 2020-05-08 – 2020-05-16 (×9): 125 mg via ORAL
  Filled 2020-05-07: qty 5
  Filled 2020-05-07 (×9): qty 1

## 2020-05-07 MED ORDER — CEFAZOLIN SODIUM-DEXTROSE 1-4 GM/50ML-% IV SOLN
1.0000 g | Freq: Once | INTRAVENOUS | Status: AC
  Administered 2020-05-07: 1 g via INTRAVENOUS
  Filled 2020-05-07: qty 50

## 2020-05-07 MED ORDER — DONEPEZIL HCL 10 MG PO TABS
10.0000 mg | ORAL_TABLET | Freq: Every day | ORAL | Status: DC
  Administered 2020-05-08 – 2020-05-15 (×9): 10 mg via ORAL
  Filled 2020-05-07 (×12): qty 1

## 2020-05-07 MED ORDER — MEMANTINE HCL 10 MG PO TABS
10.0000 mg | ORAL_TABLET | Freq: Every day | ORAL | Status: DC
  Administered 2020-05-08 – 2020-05-16 (×10): 10 mg via ORAL
  Filled 2020-05-07 (×11): qty 1

## 2020-05-07 NOTE — ED Notes (Signed)
Staff at bedside; will notify when patient can come for imaging.

## 2020-05-07 NOTE — ED Triage Notes (Signed)
Received from EMS from SNF.  EMS states nursing staff at SNF was instructed by pts family to bring pt in for wound eval .  Sacral decub stg 3/4 noted.  duoderm and packing removed for MD to evaluate, pt in no distress and denies any pain, pleasantly confused, seems hard of hearing

## 2020-05-07 NOTE — ED Notes (Signed)
Left radial artery punctured for labs, Dr Particia Nearing aware.  Pressure held 5 minutes. Patient tolerated well.

## 2020-05-07 NOTE — ED Notes (Signed)
Has stage 4 decub in sacral area 3 cm by 5 cm, foul smelling drainage

## 2020-05-07 NOTE — ED Notes (Signed)
Pt daughter updated on pt status and transfer to [facility] room 1421; given number for RN phone there

## 2020-05-07 NOTE — ED Notes (Signed)
   05/07/20 2051  Hand-Off documentation  Handoff Received Received from ED RN, ready to receive patient  Report received from (Full Name) Yvonna Alanis RN  Awaiting patient arrival to room 1421.

## 2020-05-07 NOTE — ED Provider Notes (Signed)
[facility] EMERGENCY DEPARTMENT Provider Note   CSN: [id number] Arrival date & time: 05/07/20  1359     History Chief Complaint  Patient presents with  . Skin Ulcer    family told snf to send her here for wound eval    Melinda RamsayBarbara Love is a 86 y.o. female.  Pt presents to the ED today with a skin ulcer to her back.  The pt has Alzheimer's dementia and is unable to give any hx.  The pt's daughter gives the hx.  Pt has been in the memory unit since August.  She has not been ambulatory since then.  The pt's daughter said the facility noticed a wound to her sacrum on 11/19.  [facility] on [facility] and was seen by Dr. Nelda Severeilley.  He excised the wound and debrided what he could and staged it at stage 4.  He recommended wet to dry dressings, a negative pressure wound therapy wound vac, a high protein diet, and special cushions for her wheelchair and mattress.  The facility has been unable to do these treatments and said she needs to go to a SNF.  The pt's daughter said she can't afford to put her in a SNF and brought her here because the wound was getting worse.  She did not know what to do.        Past Medical History:  Diagnosis Date  . Alzheimer disease (HCC)   . Chronic back pain     Patient Active Problem List   Diagnosis Date Noted  . Alzheimer's dementia (HCC) 05/08/2020  . Leukocytosis 05/08/2020  . Hypokalemia 05/08/2020  . Anemia 05/08/2020  . Sacral decubitus ulcer, stage IV (HCC) 05/07/2020    Past Surgical History:  Procedure Laterality Date  . ABDOMINAL HYSTERECTOMY    . CHOLECYSTECTOMY       OB History   No obstetric history on file.     History reviewed. No pertinent family history.  Social History   Tobacco Use  . Smoking status: Never Smoker  . Smokeless tobacco: Never Used  Substance Use Topics  . Alcohol use: No  . Drug use: No    Home Medications Prior to Admission medications   Medication Sig Start Date End  Date Taking? Authorizing Provider  acetaminophen (TYLENOL) 325 MG tablet Take 650 mg by mouth every 6 (six) hours as needed.    [provider]  busPIRone (BUSPAR) 10 MG tablet Take 10 mg by mouth 3 (three) times daily.    [provider]  carbamide peroxide (DEBROX) 6.5 % OTIC solution Place 3 drops into both ears at bedtime.    [provider]  cholecalciferol (VITAMIN D3) 10 MCG (400 UNIT) TABS tablet Take 500 Units by mouth.    [provider]  donepezil (ARICEPT) 10 MG tablet Take 10 mg by mouth at bedtime.    [provider]  furosemide (LASIX) 20 MG tablet Take 20 mg by mouth.    [provider]  guaifenesin (ROBITUSSIN) 100 MG/5ML syrup Take 5-10 mLs (100-200 mg total) by mouth every 4 (four) hours as needed for cough. 10/07/17   Pricilla LovelessGoldston, Scott, MD  hydroxypropyl methylcellulose / hypromellose (ISOPTO TEARS / GONIOVISC) 2.5 % ophthalmic solution Place 2 drops into both eyes.    [provider]  LORazepam (ATIVAN) 0.5 MG tablet Take 0.5 mg by mouth 2 (two) times daily.    [provider]  melatonin 1 MG TABS tablet Take  3 mg by mouth at bedtime.    [provider]  memantine (NAMENDA) 10 MG tablet Take 10 mg by mouth daily.    [provider]  risperiDONE (RISPERDAL) 0.5 MG tablet Take 0.75 mg by mouth 2 (two) times daily.    [provider]  sertraline (ZOLOFT) 100 MG tablet Take 125 mg by mouth daily.    [provider]  Skin Protectants, Misc. (EUCERIN) cream Apply topically every evening.    [provider]  traZODone (DESYREL) 50 MG tablet Take 50 mg by mouth at bedtime.    [provider]  vitamin B-12 (CYANOCOBALAMIN) 500 MCG tablet Take 500 mcg by mouth daily.    [provider]    Allergies    Sulfa antibiotics  Review of Systems   Review of Systems  Unable to perform ROS: Dementia    Physical Exam Updated Vital Signs BP (!) 143/54 (BP  Location: Left Arm)   Pulse 72   Temp 98 F (36.7 C)   Resp 12   Ht 5\' 8"  (1.727 m)   Wt 66.8 kg   SpO2 97%   BMI 22.39 kg/m   Physical Exam Vitals and nursing note reviewed.  Constitutional:      Appearance: Normal appearance.  HENT:     Head: Normocephalic and atraumatic.     Right Ear: External ear normal.     Left Ear: External ear normal.     Nose: Nose normal.     Mouth/Throat:     Mouth: Mucous membranes are moist.     Pharynx: Oropharynx is clear.  Eyes:     Extraocular Movements: Extraocular movements intact.     Conjunctiva/sclera: Conjunctivae normal.     Pupils: Pupils are equal, round, and reactive to light.  Cardiovascular:     Rate and Rhythm: Normal rate and regular rhythm.     Pulses: Normal pulses.     Heart sounds: Normal heart sounds.  Pulmonary:     Effort: Pulmonary effort is normal.     Breath sounds: Normal breath sounds.  Abdominal:     General: Abdomen is flat. Bowel sounds are normal.     Palpations: Abdomen is soft.  Musculoskeletal:        General: Normal range of motion.     Cervical back: Normal range of motion and neck supple.  Skin:    Capillary Refill: Capillary refill takes less than 2 seconds.     Comments: Stage 4 ulcer.  See pictures.  Foul smelling d/c.  Wound cultured.  Neurological:     Mental Status: She is alert. Mental status is at baseline.         ED Results / Procedures / Treatments   Labs (all labs ordered are listed, but only abnormal results are displayed) Labs Reviewed  CBC WITH DIFFERENTIAL/PLATELET - Abnormal; Notable for the following components:      Result Value   WBC 13.3 (*)    Hemoglobin 11.2 (*)    HCT 34.4 (*)    Neutro Abs 9.7 (*)    All other components within normal limits  COMPREHENSIVE METABOLIC PANEL - Abnormal; Notable for the following components:   Potassium 3.0 (*)    Chloride 96 (*)    Calcium 8.8 (*)    Albumin 2.6 (*)    Total Bilirubin 0.2 (*)    All other components  within normal limits  URINALYSIS, ROUTINE W REFLEX MICROSCOPIC - Abnormal; Notable for the following components:  APPearance CLOUDY (*)    Nitrite POSITIVE (*)    Leukocytes,Ua SMALL (*)    All other components within normal limits  URINALYSIS, MICROSCOPIC (REFLEX) - Abnormal; Notable for the following components:   Bacteria, UA MANY (*)    All other components within normal limits  CBC WITH DIFFERENTIAL/PLATELET - Abnormal; Notable for the following components:   WBC 13.9 (*)    Hemoglobin 11.3 (*)    HCT 34.2 (*)    Neutro Abs 11.1 (*)    All other components within normal limits  COMPREHENSIVE METABOLIC PANEL - Abnormal; Notable for the following components:   Potassium 3.4 (*)    Glucose, Bld 101 (*)    Total Protein 6.0 (*)    Albumin 2.4 (*)    All other components within normal limits  FERRITIN - Abnormal; Notable for the following components:   Ferritin 312 (*)    All other components within normal limits  IRON AND TIBC - Abnormal; Notable for the following components:   Iron 22 (*)    TIBC 191 (*)    All other components within normal limits  AEROBIC CULTURE (SUPERFICIAL SPECIMEN)  RESP PANEL BY RT-PCR (FLU A&B, COVID) ARPGX2  CULTURE, BLOOD (SINGLE)  URINE CULTURE  LACTIC ACID, PLASMA  LACTIC ACID, PLASMA  MAGNESIUM  TSH  T4, FREE  AMMONIA  VITAMIN B1  VITAMIN B12  FOLATE  CBG MONITORING, ED    EKG EKG Interpretation  Date/Time:  Saturday May 07 2020 16:37:37 EST Ventricular Rate:  68 PR Interval:    QRS Duration: 135 QT Interval:  520 QTC Calculation: 554 R Axis:   81 Text Interpretation: Sinus rhythm Left bundle branch block Baseline wander in lead(s) V2 nsc since EKG in 2019 Confirmed by Jacalyn Lefevre [phone]) on 05/07/2020 4:44:51 PM Also confirmed by Jacalyn Lefevre [phone]), editor Ronnie Derby [phone])  on 05/08/2020 8:41:57 AM   Radiology DG Chest 1 View  Result Date: 05/07/2020 CLINICAL DATA:  Sacral wound.  No fever or cough. EXAM:  CHEST  1 VIEW COMPARISON:  11/22/2019 FINDINGS: Cardiac silhouette is normal in size. No mediastinal or hilar masses or evidence of adenopathy. Clear lungs.  No pleural effusion or pneumothorax. Skeletal structures are demineralized but grossly intact. IMPRESSION: No active disease. Electronically Signed   By: Amie Portland M.D.   On: 05/07/2020 18:14   DG Pelvis 1-2 Views  Result Date: 05/07/2020 CLINICAL DATA:  Sacral wound.  Possible osteomyelitis. EXAM: PELVIS - 1-2 VIEW COMPARISON:  None. FINDINGS: Sacrum partly obscured by overlying bowel gas and stool. No fracture. No bone lesion. No visible bone resorption to suggest osteomyelitis. Hip joints, SI joints and symphysis pubis are normally spaced and aligned. Soft tissues are unremarkable. IMPRESSION: 1. No fracture, bone lesion or evidence of osteomyelitis. Mid to lower sacrum not well visualized. Electronically Signed   By: Amie Portland M.D.   On: 05/07/2020 18:15    Procedures Procedures (including critical care time)  Medications Ordered in ED Medications  acetaminophen (TYLENOL) tablet 650 mg (has no administration in time range)  donepezil (ARICEPT) tablet 10 mg (10 mg Oral Given 05/08/20 0017)  melatonin tablet 3 mg (3 mg Oral Given 05/08/20 0016)  memantine (NAMENDA) tablet 10 mg (10 mg Oral Given 05/08/20 1028)  sertraline (ZOLOFT) tablet 125 mg (125 mg Oral Given 05/08/20 1029)  vitamin B-12 (CYANOCOBALAMIN) tablet 500 mcg (500 mcg Oral Not Given 05/08/20 1333)  cholecalciferol (VITAMIN D3) tablet 500 Units (500 Units Oral Not Given 05/08/20 1334)  piperacillin-tazobactam (ZOSYN) IVPB 3.375 g (3.375 g Intravenous New Bag/Given 05/08/20 1031)  vancomycin (VANCOREADY) IVPB 1250 mg/250 mL (1,250 mg Intravenous New Bag/Given 05/08/20 1535)  polyvinyl alcohol (LIQUIFILM TEARS) 1.4 % ophthalmic solution 1 drop (1 drop Both Eyes Given 05/08/20 1030)  LORazepam (ATIVAN) injection 0.5 mg (has no administration in time range)  risperiDONE  (RISPERDAL) tablet 0.75 mg (has no administration in time range)  eye wash ((SODIUM/POTASSIUM/SOD CHLORIDE)) ophthalmic solution 1 drop (has no administration in time range)  ceFAZolin (ANCEF) IVPB 1 g/50 mL premix (0 g Intravenous Stopped 05/07/20 1900)  vancomycin (VANCOCIN) IVPB 1000 mg/200 mL premix (1,000 mg Intravenous New Bag/Given 05/07/20 1937)  LORazepam (ATIVAN) injection 0.5 mg (0.5 mg Intravenous Given 05/07/20 1907)  LORazepam (ATIVAN) injection 1 mg (1 mg Intravenous Given 05/07/20 1955)  potassium chloride SA (KLOR-CON) CR tablet 20 mEq (20 mEq Oral Given 05/08/20 0225)    ED Course  I have reviewed the triage vital signs and the nursing notes.  Pertinent labs & imaging results that were available during my care of the patient were reviewed by me and considered in my medical decision making (see chart for details).    MDM Rules/Calculators/A&P                         Pt's sacral decub is not getting the care it needs at her current nursing home.  Pt has no definite osteomyelitis, but she does have yellow d/c from the decub.  The area was swabbed and sent for a wound cx.  She is started on abx.  She will need admission for wound care and wound vac placement.  Pt d/w Dr. Artis Flock ([facility]) for admission.  Final Clinical Impression(s) / ED Diagnoses Final diagnoses:  Sacral decubitus ulcer, stage IV [facility])    Rx / DC Orders ED Discharge Orders    None       Jacalyn Lefevre, MD 05/08/20 1601

## 2020-05-07 NOTE — ED Notes (Signed)
Continuously pulling at saline lock, MD in room and daughter requested MD to please order a med for her, pt is used to taking xanax, saline lock was wrapped without results and she continued to pull at saline lock with daughter in room and continuously discouraging this behavior

## 2020-05-07 NOTE — ED Notes (Signed)
Pt daughter gives permission for transfer; signature pad not working at this time

## 2020-05-07 NOTE — ED Notes (Signed)
Spoke to [facility] about pt home meds that are unavailable; said they would send them over

## 2020-05-07 NOTE — Progress Notes (Signed)
Pt arrived to the floor with transport team.  Pt situated in bed. Telemetry applied and verified.  White mittens removed and green mittens applied to pt to prevent pulling out IV.  Per report pt is a difficult stick.

## 2020-05-08 ENCOUNTER — Other Ambulatory Visit: Payer: Self-pay

## 2020-05-08 ENCOUNTER — Inpatient Hospital Stay (HOSPITAL_COMMUNITY): Payer: Medicare Other

## 2020-05-08 ENCOUNTER — Encounter (HOSPITAL_COMMUNITY): Payer: Self-pay | Admitting: Family Medicine

## 2020-05-08 DIAGNOSIS — L89154 Pressure ulcer of sacral region, stage 4: Principal | ICD-10-CM

## 2020-05-08 DIAGNOSIS — F0280 Dementia in other diseases classified elsewhere without behavioral disturbance: Secondary | ICD-10-CM

## 2020-05-08 DIAGNOSIS — D72829 Elevated white blood cell count, unspecified: Secondary | ICD-10-CM

## 2020-05-08 DIAGNOSIS — D649 Anemia, unspecified: Secondary | ICD-10-CM

## 2020-05-08 DIAGNOSIS — L98429 Non-pressure chronic ulcer of back with unspecified severity: Secondary | ICD-10-CM | POA: Diagnosis present

## 2020-05-08 DIAGNOSIS — E876 Hypokalemia: Secondary | ICD-10-CM

## 2020-05-08 LAB — CBC WITH DIFFERENTIAL/PLATELET
Abs Immature Granulocytes: 0.07 10*3/uL (ref 0.00–0.07)
Basophils Absolute: 0 10*3/uL (ref 0.0–0.1)
Basophils Relative: 0 %
Eosinophils Absolute: 0.1 10*3/uL (ref 0.0–0.5)
Eosinophils Relative: 0 %
HCT: 34.2 % — ABNORMAL LOW (ref 36.0–46.0)
Hemoglobin: 11.3 g/dL — ABNORMAL LOW (ref 12.0–15.0)
Immature Granulocytes: 1 %
Lymphocytes Relative: 13 %
Lymphs Abs: 1.8 10*3/uL (ref 0.7–4.0)
MCH: 27.7 pg (ref 26.0–34.0)
MCHC: 33 g/dL (ref 30.0–36.0)
MCV: 83.8 fL (ref 80.0–100.0)
Monocytes Absolute: 0.8 10*3/uL (ref 0.1–1.0)
Monocytes Relative: 6 %
Neutro Abs: 11.1 10*3/uL — ABNORMAL HIGH (ref 1.7–7.7)
Neutrophils Relative %: 80 %
Platelets: 302 10*3/uL (ref 150–400)
RBC: 4.08 MIL/uL (ref 3.87–5.11)
RDW: 13.2 % (ref 11.5–15.5)
WBC: 13.9 10*3/uL — ABNORMAL HIGH (ref 4.0–10.5)
nRBC: 0 % (ref 0.0–0.2)

## 2020-05-08 LAB — COMPREHENSIVE METABOLIC PANEL
ALT: 12 U/L (ref 0–44)
AST: 16 U/L (ref 15–41)
Albumin: 2.4 g/dL — ABNORMAL LOW (ref 3.5–5.0)
Alkaline Phosphatase: 89 U/L (ref 38–126)
Anion gap: 12 (ref 5–15)
BUN: 10 mg/dL (ref 8–23)
CO2: 30 mmol/L (ref 22–32)
Calcium: 8.9 mg/dL (ref 8.9–10.3)
Chloride: 99 mmol/L (ref 98–111)
Creatinine, Ser: 0.68 mg/dL (ref 0.44–1.00)
GFR, Estimated: >60 mL/min (ref >60)
Glucose, Bld: 101 mg/dL — ABNORMAL HIGH (ref 70–99)
Potassium: 3.4 mmol/L — ABNORMAL LOW (ref 3.5–5.1)
Sodium: 141 mmol/L (ref 135–145)
Total Bilirubin: 0.6 mg/dL (ref 0.3–1.2)
Total Protein: 6 g/dL — ABNORMAL LOW (ref 6.5–8.1)

## 2020-05-08 LAB — LACTIC ACID, PLASMA: Lactic Acid, Venous: 1.1 mmol/L (ref 0.5–1.9)

## 2020-05-08 LAB — IRON AND TIBC
Iron: 22 ug/dL — ABNORMAL LOW (ref 28–170)
Saturation Ratios: 12 % (ref 10.4–31.8)
TIBC: 191 ug/dL — ABNORMAL LOW (ref 250–450)
UIBC: 169 ug/dL

## 2020-05-08 LAB — CBG MONITORING, ED: Glucose-Capillary: 99 mg/dL (ref 70–99)

## 2020-05-08 LAB — FOLATE: Folate: 11.4 ng/mL (ref >5.9)

## 2020-05-08 LAB — MAGNESIUM: Magnesium: 1.9 mg/dL (ref 1.7–2.4)

## 2020-05-08 LAB — VITAMIN B12: Vitamin B-12: 954 pg/mL — ABNORMAL HIGH (ref 180–914)

## 2020-05-08 LAB — TSH: TSH: 4.754 u[IU]/mL — ABNORMAL HIGH (ref 0.350–4.500)

## 2020-05-08 LAB — T4, FREE: Free T4: 0.98 ng/dL (ref 0.61–1.12)

## 2020-05-08 LAB — FERRITIN: Ferritin: 312 ng/mL — ABNORMAL HIGH (ref 11–307)

## 2020-05-08 LAB — AMMONIA: Ammonia: 31 umol/L (ref 9–35)

## 2020-05-08 MED ORDER — VANCOMYCIN HCL 1250 MG/250ML IV SOLN
1250.0000 mg | INTRAVENOUS | Status: DC
  Administered 2020-05-08 – 2020-05-10 (×3): 1250 mg via INTRAVENOUS
  Filled 2020-05-08 (×3): qty 250

## 2020-05-08 MED ORDER — EYE WASH OPHTH SOLN: 1.0000 [drp] | OPHTHALMIC | Status: DC | PRN

## 2020-05-08 MED ORDER — CYANOCOBALAMIN 500 MCG PO TABS
500.0000 ug | ORAL_TABLET | Freq: Every day | ORAL | Status: DC
  Administered 2020-05-09 – 2020-05-16 (×8): 500 ug via ORAL
  Filled 2020-05-08 (×8): qty 1

## 2020-05-08 MED ORDER — ENOXAPARIN SODIUM 40 MG/0.4ML ~~LOC~~ SOLN: 40.0000 mg | SUBCUTANEOUS | Status: DC

## 2020-05-08 MED ORDER — HYPROMELLOSE (GONIOSCOPIC) 2.5 % OP SOLN: 2.0000 [drp] | Freq: Two times a day (BID) | OPHTHALMIC | Status: DC

## 2020-05-08 MED ORDER — POLYVINYL ALCOHOL 1.4 % OP SOLN
1.0000 [drp] | Freq: Two times a day (BID) | OPHTHALMIC | Status: DC
  Administered 2020-05-08 – 2020-05-16 (×17): 1 [drp] via OPHTHALMIC
  Filled 2020-05-08: qty 15

## 2020-05-08 MED ORDER — LORAZEPAM 2 MG/ML IJ SOLN
0.5000 mg | Freq: Four times a day (QID) | INTRAMUSCULAR | Status: DC | PRN
  Administered 2020-05-14: 0.5 mg via INTRAVENOUS
  Filled 2020-05-08: qty 1

## 2020-05-08 MED ORDER — POLYVINYL ALCOHOL 1.4 % OP SOLN
1.0000 [drp] | OPHTHALMIC | Status: DC | PRN
  Administered 2020-05-13: 1 [drp] via OPHTHALMIC
  Filled 2020-05-08: qty 15

## 2020-05-08 MED ORDER — RISPERIDONE 0.5 MG PO TABS
0.7500 mg | ORAL_TABLET | Freq: Every day | ORAL | Status: DC
  Administered 2020-05-09 – 2020-05-15 (×7): 0.75 mg via ORAL
  Filled 2020-05-08 (×7): qty 1

## 2020-05-08 MED ORDER — POTASSIUM CHLORIDE CRYS ER 20 MEQ PO TBCR
20.0000 meq | EXTENDED_RELEASE_TABLET | Freq: Once | ORAL | Status: AC
  Administered 2020-05-08: 20 meq via ORAL
  Filled 2020-05-08: qty 1

## 2020-05-08 MED ORDER — PIPERACILLIN-TAZOBACTAM 3.375 G IVPB
3.3750 g | Freq: Three times a day (TID) | INTRAVENOUS | Status: DC
  Administered 2020-05-08 – 2020-05-10 (×9): 3.375 g via INTRAVENOUS
  Filled 2020-05-08 (×9): qty 50

## 2020-05-08 MED ORDER — VITAMIN D 25 MCG (1000 UNIT) PO TABS
5000.0000 [IU] | ORAL_TABLET | Freq: Every day | ORAL | Status: DC
  Administered 2020-05-09 – 2020-05-16 (×8): 5000 [IU] via ORAL
  Filled 2020-05-08 (×8): qty 5

## 2020-05-08 NOTE — Progress Notes (Signed)
[facility] Progress Note  Patient: Melinda Love    DJT:[id number]  DOA: 05/07/2020     Date of Service: the patient was seen and examined on 05/08/2020  Brief hospital course: Past medical history of dementia, bedbound status and sacral ulcer presents with complaints of fatigue and tiredness.  Currently plan is currently on IV antibiotics for cellulitis.  Assessment and Plan: 1.  Cellulitis of second cubitus ulcer stage IV, POA No evidence of osteomyelitis. Ulcer actually appears to be healing but had some foul-smelling discharge. Continue wound care. Continue nutritional supplement. Continue IV antibiotics for now although low threshold to switch to oral antibiotics. PT OT consulted.  2.  Alzheimer's dementia Acute metabolic encephalopathy Patient at her baseline communicates.  Back in June was able to ambulate without any difficulty. Now currently bedbound and confused. Etiology of this is not clear. We will initiate further work-up including CT scan of the head as well as metabolic work-up. Monitor. Monitor for sundowning. Discontinue secondary to medication that are scheduled and change them to as needed only.  3.  Hypokalemia Replacement sent monitor.  4.  Anemia Monitor.  Will require iron supplementation   Pressure Injury 05/07/20 Sacrum Left Stage 4 - Full thickness tissue loss with exposed bone, tendon or muscle. (Active)  05/07/20 2240  Location: Sacrum  Location Orientation: Left  Staging: Stage 4 - Full thickness tissue loss with exposed bone, tendon or muscle.  Wound Description (Comments):   Present on Admission: Yes     Diet: Dysphagia 1 diet DVT Prophylaxis: Subcutaneous Heparin       Advance goals of care discussion: Full code  Family Communication: family was present at bedside, at the time of interview.  The pt provided permission to discuss medical plan with the family. Opportunity was given to ask question and all questions were answered  satisfactorily.   Disposition:  Status is: Inpatient  Remains inpatient appropriate because:Altered mental status   Dispo: The patient is from: SNF              Anticipated d/c is to: SNF              Anticipated d/c date is: 2 days              Patient currently is not medically stable to d/c.  Subjective: No nausea no vomiting.  No fever no chills.  No acute events overnight.  Currently confused and unable to follow any commands.  Unable to answer any questions.  Physical Exam:  General: Appear in mild distress, no Rash; Oral Mucosa Clear, dry. no Abnormal Neck Mass Or lumps, Conjunctiva normal  Cardiovascular: S1 and S2 Present, no Murmur, Respiratory: increased respiratory effort, Bilateral Air entry present and CTA, no Crackles, no wheezes Abdomen: Bowel Sound present, Soft and difficult to assess tenderness Extremities: no Pedal edema Neurology: lethargic and not oriented to time, place, and person affect unresponsive. no new focal deficit Gait not checked due to patient safety concerns  Vitals:   05/08/20 0619 05/08/20 0626 05/08/20 1017 05/08/20 1816  BP: (!) 124/53 (!) 147/65 (!) 143/54 (!) 145/71  Pulse: 69 73 72 76  Resp: 20  12 14   Temp: 97.9 F (36.6 C)  98 F (36.7 C) (!) 97.5 F (36.4 C)  TempSrc: Oral   Oral  SpO2: 95%  97% 95%  Weight:      Height:        Intake/Output Summary (Last 24 hours) at 05/08/2020 1906 Last data filed at  05/08/2020 1325 Gross per 24 hour  Intake 50 ml  Output 1351 ml  Net -1301 ml   Filed Weights   05/07/20 1418 05/07/20 2225  Weight: 69.2 kg 66.8 kg    Data Reviewed: I have personally reviewed and interpreted daily labs, tele strips, imagings as discussed above. I reviewed all nursing notes, pharmacy notes, vitals, pertinent old records I have discussed plan of care as described above with RN and patient/family.  CBC: Recent Labs  Lab 05/07/20 1715 05/08/20 0616  WBC 13.3* 13.9*  NEUTROABS 9.7* 11.1*  HGB 11.2*  11.3*  HCT 34.4* 34.2*  MCV 83.7 83.8  PLT 295 302   Basic Metabolic Panel: Recent Labs  Lab 05/07/20 1715 05/08/20 0616  NA 137 141  K 3.0* 3.4*  CL 96* 99  CO2 30 30  GLUCOSE 91 101*  BUN 12 10  CREATININE 0.60 0.68  CALCIUM 8.8* 8.9  MG  --  1.9    Studies: CT HEAD WO CONTRAST  Result Date: 05/08/2020 CLINICAL DATA:  Mental status change, unknown cause. Additional provided: Patient admitted with wound infection, dementia. EXAM: CT HEAD WITHOUT CONTRAST TECHNIQUE: Contiguous axial images were obtained from the base of the skull through the vertex without intravenous contrast. COMPARISON:  Prior noncontrast head CT examination 11/22/2019 and earlier. FINDINGS: Brain: Mild-to-moderate cerebral atrophy with a medial temporal lobe predominance. Advanced ill-defined hypoattenuation within the cerebral white matter is nonspecific, but compatible chronic small vessel ischemic disease. There is no acute intracranial hemorrhage. No demarcated cortical infarct. No extra-axial fluid collection. No evidence of intracranial mass. No midline shift. Vascular: No hyperdense vessel.  Atherosclerotic calcifications. Skull: Normal. Negative for fracture or focal lesion. Sinuses/Orbits: Visualized orbits show no acute finding. Minimal ethmoid sinus mucosal thickening. Other: Trace bilateral mastoid effusions. IMPRESSION: No evidence of acute intracranial abnormality. Mild/moderate cerebral atrophy with a medial temporal lobe predominance. Advanced chronic small vessel ischemic disease. Small bilateral mastoid effusions. Electronically Signed   By: Jackey Loge DO   On: 05/08/2020 16:47    Scheduled Meds: . cholecalciferol  5,000 Units Oral Daily  . donepezil  10 mg Oral QHS  . melatonin  3 mg Oral QHS  . memantine  10 mg Oral Daily  . polyvinyl alcohol  1 drop Both Eyes BID  . [START ON 05/09/2020] risperiDONE  0.75 mg Oral QHS  . sertraline  125 mg Oral Daily  . vitamin B-12  500 mcg Oral Daily    Continuous Infusions: . piperacillin-tazobactam (ZOSYN)  IV 3.375 g (05/08/20 1811)  . vancomycin 1,250 mg (05/08/20 1535)   PRN Meds: acetaminophen, LORazepam, polyvinyl alcohol  Time spent: 35 minutes  Author: Lynden Oxford, MD [facility] 05/08/2020 7:06 PM  To reach On-call, see care teams to locate the attending and reach out via [email or web address]. Between 7PM-7AM, please contact night-coverage If you still have difficulty reaching the attending provider, please page the [facility] (Director on Call) for [facility] on amion for assistance.

## 2020-05-08 NOTE — Progress Notes (Signed)
Pharmacy Antibiotic Note  Melinda Love is a 86 y.o. female admitted on 05/07/2020 with wound infection.  Pharmacy has been consulted for Vancomycin & Zosyn dosing.  Afebrile Mild leukocytosis Renal function at baseline  Plan: Zosyn 3.375g IV q8h (4 hour infusion).  Vancomycin 1250mg  IV q24h to target trough ~15 Monitor renal function and cx data  Daily Scr while on Vanc/Zosyn Vancomycin levels at steady state as indicated  Height: 5\' 8"  (172.7 cm) Weight: 66.8 kg (147 lb 4.3 oz) IBW/kg (Calculated) : 63.9  Temp (24hrs), Avg:98.6 F (37 C), Min:98.4 F (36.9 C), Max:98.9 F (37.2 C)  Recent Labs  Lab 05/07/20 1618 05/07/20 1715  WBC  --  13.3*  CREATININE  --  0.60  LATICACIDVEN 1.5  --     Estimated Creatinine Clearance: 52.8 mL/min (by C-G formula based on SCr of 0.6 mg/dL).    Allergies  Allergen Reactions  . Sulfa Antibiotics     Antimicrobials this admission: 12/4 Vanc >>  12/5 Zosyn >>  12/4 Ancef x1  Dose adjustments this admission:  Microbiology results: 12/4 BCx:  12/4 UCx:   12/4 Sacral wound Cx:  12/4 Resp PCR: negative for COVID/influenza  Thank you for allowing pharmacy to be a part of this patient's care.  14/4 PharmD, BCPS 05/08/2020 1:17 AM

## 2020-05-08 NOTE — H&P (Signed)
History and Physical    Melinda RamsayBarbara Cundari YNW:[id number] DOB: 1933/06/12 DOA: 05/07/2020  PCP: Patient, No Pcp Per  [facility]: [facility]   I have personally briefly reviewed patient's old medical records [facility]  Chief Complaint: family told SNF to send her here for wound evalulation.  HPI: Melinda Love is a 86 y.o. female with medical history significant for Alzheimers dementia and is unable to give history. History obtained from her daughter, Rock NephewGatonna. [facility] August and not ambulatory since this time. The facility noticed a wound on her sacrum on 11/19 and [facility] on 11/23 and seen by a Dr. Nelda Severeilley. Excision and debridement of the wound was done and was staged as a stage 4. Recommendations were made that included wet to dry dressings, a negative pressure wound therapy vac, a high protein diet and special cushions for her WC and mattress. Her memory unit did not do these treatment and said they could not and stated she needed to go to a SNF. Daughter unsure of what to do so brought her to ER since wound not treated and appears to be getting worse. Daughter also states her dementia has progressively gotten worse over the last 2 months.   ED Course: vitals stable on arrival with BP of 146/69, hr of 72, afebrile, RR of 15 and oxygen of 93% on room air. Culture and pictures of wound taken. Lactic acid was normal, WBC to 13.3 and hgb of 11.2/hct of 34.4. potassium at 3.0. UA appeared dirty with + nitrite and small LE. Urine culture Is pending. CXR clear and pelvis xray showed no bony evidence of osteomyelitis. Given dose of ancef and vanc in ER. Asked to admit and [facility].   Review of Systems: unable to obtain due to dementia.    Past Medical History:  Diagnosis Date  . Alzheimer disease (HCC)   . Chronic back pain     Past Surgical History:  Procedure Laterality Date  .  ABDOMINAL HYSTERECTOMY    . CHOLECYSTECTOMY      Social History  reports that she has never smoked. She has never used smokeless tobacco. She reports that she does not drink alcohol and does not use drugs.  Allergies  Allergen Reactions  . Sulfa Antibiotics     History reviewed. No pertinent family history.   Prior to Admission medications   Medication Sig Start Date End Date Taking? Authorizing Provider  acetaminophen (TYLENOL) 325 MG tablet Take 650 mg by mouth every 6 (six) hours as needed.    [provider]  busPIRone (BUSPAR) 10 MG tablet Take 10 mg by mouth 3 (three) times daily.    [provider]  carbamide peroxide (DEBROX) 6.5 % OTIC solution Place 3 drops into both ears at bedtime.    [provider]  cholecalciferol (VITAMIN D3) 10 MCG (400 UNIT) TABS tablet Take 500 Units by mouth.    [provider]  donepezil (ARICEPT) 10 MG tablet Take 10 mg by mouth at bedtime.    [provider]  furosemide (LASIX) 20 MG tablet Take 20 mg by mouth.    [provider]  guaifenesin (ROBITUSSIN) 100 MG/5ML syrup Take 5-10 mLs (100-200 mg total) by mouth every 4 (four) hours as needed for cough. 10/07/17   Pricilla LovelessGoldston, Scott, MD  hydroxypropyl methylcellulose / hypromellose (ISOPTO TEARS / GONIOVISC) 2.5 % ophthalmic solution Place 2  drops into both eyes.    [provider]  LORazepam (ATIVAN) 0.5 MG tablet Take 0.5 mg by mouth 2 (two) times daily.    [provider]  melatonin 1 MG TABS tablet Take 3 mg by mouth at bedtime.    [provider]  memantine (NAMENDA) 10 MG tablet Take 10 mg by mouth daily.    [provider]  risperiDONE (RISPERDAL) 0.5 MG tablet Take 0.75 mg by mouth 2 (two) times daily.    [provider]  sertraline (ZOLOFT) 100 MG tablet Take 125 mg by mouth daily.    [provider]  Skin Protectants, Misc. (EUCERIN) cream Apply topically every evening.    [provider]  traZODone (DESYREL) 50 MG tablet Take 50 mg by mouth at bedtime.    [provider]  vitamin B-12 (CYANOCOBALAMIN) 500 MCG tablet Take 500 mcg by mouth daily.    [provider]    Physical Exam: Vitals:   05/07/20 1900 05/07/20 2000 05/07/20 2124 05/07/20 2225  BP: 132/68 137/77 (!) 129/92 131/60  Pulse: 75 83 76 72  Resp: (!) 25 20 (!) 21 20  Temp:    98.9 F (37.2 C)  TempSrc:    Oral  SpO2: 97% 97% 97% 93%  Weight:    66.8 kg  Height:        Constitutional: NAD, calm, comfortable Vitals:   05/07/20 1900 05/07/20 2000 05/07/20 2124 05/07/20 2225  BP: 132/68 137/77 (!) 129/92 131/60  Pulse: 75 83 76 72  Resp: (!) 25 20 (!) 21 20  Temp:    98.9 F (37.2 C)  TempSrc:    Oral  SpO2: 97% 97% 97% 93%  Weight:    66.8 kg  Height:       Eyes: PERRL, lids and conjunctivae normal ENMT: Mucous membranes are moist. Posterior pharynx clear of any exudate or lesions.Normal dentition.  Neck: normal, supple, no masses, no thyromegaly Respiratory: clear to auscultation bilaterally, no wheezing, no crackles. Normal respiratory effort. No accessory muscle use.  Cardiovascular: Regular rate and rhythm, no murmurs / rubs / gallops. No extremity edema. 2+ pedal pulses. No carotid bruits.  Abdomen: no tenderness, no masses palpated. No hepatosplenomegaly. Bowel sounds positive.  Musculoskeletal: no clubbing / cyanosis. No joint deformity upper and lower extremities. Skin: sacral ulcer that is about 3.5 cm in diameter and able to probe down. No bleeding. Foul smell  Neurologic: alert, at her baseline. Does not answer questions or follow directions.  Psychiatric: not oriented.    Labs on Admission: I have personally reviewed following labs and imaging studies  CBC: Recent Labs  Lab 05/07/20 1715  WBC 13.3*  NEUTROABS 9.7*  HGB 11.2*  HCT 34.4*  MCV 83.7  PLT 295    Basic Metabolic Panel: Recent Labs  Lab 05/07/20 1715  NA 137  K 3.0*  CL 96*   CO2 30  GLUCOSE 91  BUN 12  CREATININE 0.60  CALCIUM 8.8*    GFR: Estimated Creatinine Clearance: 52.8 mL/min (by C-G formula based on SCr of 0.6 mg/dL).  Liver Function Tests: Recent Labs  Lab 05/07/20 1715  AST 15  ALT 12  ALKPHOS 90  BILITOT 0.2*  PROT 6.6  ALBUMIN 2.6*    Urine analysis:    Component Value Date/Time   COLORURINE YELLOW 05/07/2020 1607   APPEARANCEUR CLOUDY (A) 05/07/2020 1607   LABSPEC 1.010 05/07/2020 1607   PHURINE 7.0 05/07/2020 1607   GLUCOSEU NEGATIVE 05/07/2020 1607  HGBUR NEGATIVE 05/07/2020 1607   BILIRUBINUR NEGATIVE 05/07/2020 1607   KETONESUR NEGATIVE 05/07/2020 1607   PROTEINUR NEGATIVE 05/07/2020 1607   NITRITE POSITIVE (A) 05/07/2020 1607   LEUKOCYTESUR SMALL (A) 05/07/2020 1607    Radiological Exams on Admission: DG Chest 1 View  Result Date: 05/07/2020 CLINICAL DATA:  Sacral wound.  No fever or cough. EXAM: CHEST  1 VIEW COMPARISON:  11/22/2019 FINDINGS: Cardiac silhouette is normal in size. No mediastinal or hilar masses or evidence of adenopathy. Clear lungs.  No pleural effusion or pneumothorax. Skeletal structures are demineralized but grossly intact. IMPRESSION: No active disease. Electronically Signed   By: Amie Portland M.D.   On: 05/07/2020 18:14   DG Pelvis 1-2 Views  Result Date: 05/07/2020 CLINICAL DATA:  Sacral wound.  Possible osteomyelitis. EXAM: PELVIS - 1-2 VIEW COMPARISON:  None. FINDINGS: Sacrum partly obscured by overlying bowel gas and stool. No fracture. No bone lesion. No visible bone resorption to suggest osteomyelitis. Hip joints, SI joints and symphysis pubis are normally spaced and aligned. Soft tissues are unremarkable. IMPRESSION: 1. No fracture, bone lesion or evidence of osteomyelitis. Mid to lower sacrum not well visualized. Electronically Signed   By: Amie Portland M.D.   On: 05/07/2020 18:15   EKG: Independently reviewed. nsr with lbb. No change from previous ekg.   Assessment/Plan Principal  Problem:    Sacral decubitus ulcer, stage IV (HCC) -no evidence of osteomyelitis based off xray and probing -needs optimized nutrition which will be difficult due to her dementia. Nutrition consult placed -likely needs wound vac. Wound care consult placed.  -wound culture/blood culture pending. Lactic acid normal, does have white count.  -vanc/ancef in ER.  --vanc/zosyn for broad coverage until can be tailored from cultures if needed. Pharm consulted.  -social work consulted for placement since she can not return to [facility] with ulcer.   Active Problems:   Alzheimer's dementia (HCC) -history from daughter -continue home meds     Leukocytosis -infection vs. hemoconcentrated vs. Inflammatory -urine culture, blood culture and wound culture pending -lactic acid wnl -received abx in ER. (vanc/ancef) continuing vanc/zosyn for broad coverage -urine looks dirty, will wait on treating this. Hard to know if symptomatic.      Hypokalemia -replete orally and recheck in AM -check magnesium     Anemia Likely secondary to ACD. Iron studies pending     DVT prophylaxis: lovenox  Code Status:   Full code, but daughter states that she likely would like this changed.  Family Communication:  Called and discussed with daughter Rock Nephew [phone] Disposition Plan:   Patient is from:  [facility]  Anticipated DC to:  SNF  Anticipated DC date:  2-3 midnights  Anticipated DC barriers: Placement   Consults called:  None  Admission status:  Inpatient     Orland Mustard MD [facility]  How to contact the [facility] Attending or Consulting provider 7A - 7P or covering provider during after hours 7P -7A, for this patient?   1. Check the care team in [facility] and look for a) attending/consulting TRH provider listed and b) the [facility] team listed 2. Log into [email or web address] and use [place]'s universal password to access. If you do not have the password, please contact the  hospital operator. 3. Locate the [facility] provider you are looking for under [facility] and page to a number that you can be directly reached. 4. If you still have difficulty reaching the provider, please page the [facility] (Director on Call)  for the Hospitalists listed on amion for assistance.  05/08/2020, 1:06 AM

## 2020-05-08 NOTE — Plan of Care (Signed)

## 2020-05-08 NOTE — Evaluation (Signed)
Clinical/Bedside Swallow Evaluation Patient Details  Name: Melinda Love MRN: [id number] Date of Birth: 08-17-1933  Today's Date: 05/08/2020 Time: SLP Start Time (ACUTE ONLY): 1530 SLP Stop Time (ACUTE ONLY): 1545 SLP Time Calculation (min) (ACUTE ONLY): 15 min  Past Medical History:  Past Medical History:  Diagnosis Date  . Alzheimer disease (HCC)   . Chronic back pain    Past Surgical History:  Past Surgical History:  Procedure Laterality Date  . ABDOMINAL HYSTERECTOMY    . CHOLECYSTECTOMY     HPI:  Melinda Love is a 86 y.o. female with medical history significant for Alzheimers dementia and is unable to give history. Family reports dementia has greatly worsened over past two months and that she has become somnolent most of the time with intermittent verbalizations/alertness.   Assessment / Plan / Recommendation Clinical Impression  Ms. Steeber was seen with family (granddaughter) present who endorses ongoing somnolence and decreased verbal output over the past couple of months. She was lethargic, requiring sternal rub to arouse for intake. Oral mech exam was limited, given lethargy, but she was noted with natural dentition and no obvious asymmetry. She consumed thin liquids via spoon, cup, and straw without s/s aspiration, and consumed pureed via tsp with noted slowed oral transit. Regular solids were deferred given lethargy and slow oral phase. She has been taking meds crushed in puree without difficulty per RN. No s/s of pharyngeal dysphagia/aspiration were observed. Suspect oral deficits are r/t mentation/decreased alertness.  Recommend: dysphagia 1 solids, thin liquids, meds crushed in puree, oral care BID, ST to follow for diet advancement as mentation improves.  Recommendations discussed with RN and family present.    SLP Visit Diagnosis: Dysphagia, oral phase (R13.11)    Aspiration Risk  Mild aspiration risk    Diet Recommendation Dysphagia 1 (Puree);Thin liquid    Medication Administration: Crushed with puree Compensations: Small sips/bites;Slow rate Postural Changes: Seated upright at 90 degrees;Remain upright for at least 30 minutes after po intake    Other  Recommendations Oral Care Recommendations: Oral care BID;Staff/trained caregiver to provide oral care   Follow up Recommendations Skilled Nursing facility      Frequency and Duration min 1 x/week  1 week       Prognosis Barriers to Reach Goals: Cognitive deficits      Swallow Study   General Date of Onset: 05/07/20 HPI: Cheyrl Love is a 86 y.o. female with medical history significant for Alzheimers dementia and is unable to give history. Family reports dementia has greatly worsened over past two months and that she has become somnolent most of the time with intermittent verbalizations/alertness. Type of Study: Bedside Swallow Evaluation Previous Swallow Assessment: n/a Diet Prior to this Study: Regular;Thin liquids Temperature Spikes Noted: No Respiratory Status: Room air History of Recent Intubation: No Behavior/Cognition: Lethargic/Drowsy Oral Cavity Assessment: Within Functional Limits Oral Care Completed by SLP: Recent completion by staff Oral Cavity - Dentition: Adequate natural dentition Self-Feeding Abilities: Total assist Patient Positioning: Upright in bed Baseline Vocal Quality: Low vocal intensity Volitional Cough: Cognitively unable to elicit Volitional Swallow: Unable to elicit    Oral/Motor/Sensory Function Overall Oral Motor/Sensory Function: Other (comment) (unable to fully participate)   Thin Liquid Thin Liquid: Within functional limits Presentation: Straw;Cup;Spoon    Puree Puree: Within functional limits Presentation: Spoon   Solid     Solid: Not tested Other Comments: deferred given lethargy and slow oral transit with purees     Yissel Habermehl P. Mario Coronado, M.S., CCC-SLP Speech-Language Pathologist Acute Rehabilitation Services  Pager:  [phone]  Jahnia Hewes P Emerlyn Mehlhoff 05/08/2020,3:48 PM

## 2020-05-09 LAB — CREATININE, SERUM
Creatinine, Ser: 0.7 mg/dL (ref 0.44–1.00)
GFR, Estimated: >60 mL/min (ref >60)

## 2020-05-09 LAB — VITAMIN B1

## 2020-05-09 MED ORDER — INFLUENZA VAC A&B SA ADJ QUAD 0.5 ML IM PRSY
0.5000 mL | PREFILLED_SYRINGE | INTRAMUSCULAR | Status: AC
  Administered 2020-05-10: 0.5 mL via INTRAMUSCULAR
  Filled 2020-05-09: qty 0.5

## 2020-05-09 MED ORDER — SODIUM CHLORIDE 0.9 % IV SOLN
INTRAVENOUS | Status: DC | PRN
  Administered 2020-05-09: 250 mL via INTRAVENOUS

## 2020-05-09 MED ORDER — COLLAGENASE 250 UNIT/GM EX OINT
TOPICAL_OINTMENT | Freq: Every day | CUTANEOUS | Status: DC
  Filled 2020-05-09 (×2): qty 30

## 2020-05-09 NOTE — NC FL2 (Signed)
[place] MEDICAID FL2 LEVEL OF CARE SCREENING TOOL     IDENTIFICATION  Patient Name: Melinda Love Birthdate: [birth year] Sex: female Admission Date (Current Location): 05/07/2020  [facility] and [place] Number:  Producer, television/film/video and Address:  [facility],  [address] [place], [place] [address]      Provider Number: [id number]  Attending Physician Name and Address:  Rolly Salter, MD  Relative Name and Phone Number:  Oralia Rud Daughter [phone]  [phone] or Wille Celeste Granddaughter   [phone]    Current Level of Care: Hospital Recommended Level of Care: Skilled Nursing Facility Prior Approval Number:    Date Approved/Denied:   PASRR Number: [id number]  Discharge Plan: SNF    Current Diagnoses: Patient Active Problem List   Diagnosis Date Noted  . Alzheimer's dementia (HCC) 05/08/2020  . Leukocytosis 05/08/2020  . Hypokalemia 05/08/2020  . Anemia 05/08/2020  . Sacral decubitus ulcer, stage IV (HCC) 05/07/2020    Orientation RESPIRATION BLADDER Height & Weight     Self  Normal Incontinent Weight: 147 lb 4.3 oz (66.8 kg) Height:  5\' 8"  (172.7 cm)  BEHAVIORAL SYMPTOMS/MOOD NEUROLOGICAL BOWEL NUTRITION STATUS      Incontinent Diet  AMBULATORY STATUS COMMUNICATION OF NEEDS Skin   Limited Assist Verbally PU Stage and Appropriate Care       PU Stage 4 Dressing: Daily               Personal Care Assistance Level of Assistance  Bathing, Feeding, Dressing Bathing Assistance: Limited assistance Feeding assistance: Limited assistance Dressing Assistance: Limited assistance     Functional Limitations Info  Sight, Speech, Hearing Sight Info: Adequate Hearing Info: Adequate Speech Info: Adequate    SPECIAL CARE FACTORS FREQUENCY  PT (By licensed PT), OT (By licensed OT)     PT Frequency: Min 5x a week OT Frequency: Min 5x a week            Contractures Contractures Info: Not present    Additional Factors Info  Code  Status, Allergies, Psychotropic Code Status Info: Full code Allergies Info: Sulfa Antibiotics Psychotropic Info: risperiDONE (RISPERDAL) tablet 0.75 mg and sertraline (ZOLOFT) tablet 125 mg         Current Medications (05/09/2020):  This is the current hospital active medication list Current Facility-Administered Medications  Medication Dose Route Frequency Provider Last Rate Last Admin  . acetaminophen (TYLENOL) tablet 650 mg  650 mg Oral Q6H PRN 14/11/2019, MD      . cholecalciferol (VITAMIN D3) tablet 5,000 Units  5,000 Units Oral Daily Orland Mustard, MD   5,000 Units at 05/09/20 1109  . collagenase (SANTYL) ointment   Topical Daily 14/06/21, MD   Given at 05/09/20 1046  . donepezil (ARICEPT) tablet 10 mg  10 mg Oral QHS 14/06/21, MD   10 mg at 05/08/20 2248  . LORazepam (ATIVAN) injection 0.5 mg  0.5 mg Intravenous Q6H PRN 2249, MD      . melatonin tablet 3 mg  3 mg Oral QHS Rolly Salter, MD   3 mg at 05/08/20 2248  . memantine (NAMENDA) tablet 10 mg  10 mg Oral Daily 2249, MD   10 mg at 05/09/20 1109  . piperacillin-tazobactam (ZOSYN) IVPB 3.375 g  3.375 g Intravenous Q8H 14/06/21, MD 12.5 mL/hr at 05/09/20 1047 3.375 g at 05/09/20 1047  . polyvinyl alcohol (LIQUIFILM TEARS) 1.4 % ophthalmic solution 1 drop  1 drop Both Eyes BID 14/06/21, MD  1 drop at 05/08/20 2250  . polyvinyl alcohol (LIQUIFILM TEARS) 1.4 % ophthalmic solution 1 drop  1 drop Both Eyes PRN Rolly Salter, MD      . risperiDONE (RISPERDAL) tablet 0.75 mg  0.75 mg Oral QHS Rolly Salter, MD      . sertraline (ZOLOFT) tablet 125 mg  125 mg Oral Daily Orland Mustard, MD   125 mg at 05/09/20 1108  . vancomycin (VANCOREADY) IVPB 1250 mg/250 mL  1,250 mg Intravenous Q24H Orland Mustard, MD 166.7 mL/hr at 05/08/20 1535 1,250 mg at 05/08/20 1535  . vitamin B-12 (CYANOCOBALAMIN) tablet 500 mcg  500 mcg Oral Daily Orland Mustard, MD   500 mcg at 05/09/20 1109      Discharge Medications: Please see discharge summary for a list of discharge medications.  Relevant Imaging Results:  Relevant Lab Results:   Additional Information SSN [id number]  Darleene Cleaver, LCSW

## 2020-05-09 NOTE — Progress Notes (Signed)
   05/09/20  Hydrotherapy Evaluation [id number]     Subjective Patient answers simple questions. Daughter  present and asking about  PLS treatment.  Patient and Family Stated Goals per daughter, to heal wound, walk again.  Date of Onset  (present on admission)  Prior Treatments seen in Wound clinic, dressing changes   Evaluation and Treatment  Evaluation and Treatment Procedures Explained to Patient/Family Yes  Evaluation and Treatment Procedures agreed to (daughter)  Pressure Injury 05/09/20 Sacrum Right;Mid Stage 4 - Full thickness tissue loss with exposed bone, tendon or muscle. PT ONLY- wound is on the right guteal area, above gluteal cleft.   Date First Assessed/Time First Assessed: 05/09/20 1100   Location: Sacrum  Location Orientation: Right;Mid  Staging: Stage 4 - Full thickness tissue loss with exposed bone, tendon or muscle.  Wound Description (Comments): PT ONLY- wound is on the righ...  Dressing Type ABD;Gauze (Comment);Normal saline moist dressing (santyl)  Dressing Changed  Dressing Change Frequency Daily  State of Healing Non-healing  Site / Wound Assessment Painful  % Wound base Red or Granulating 0%  % Wound base Yellow/Fibrinous Exudate 50%  % Wound base Black/Eschar 50%  Peri-wound Assessment Intact  Wound Length (cm) 3.5 cm  Wound Width (cm) 3 cm  Wound Depth (cm) 2.5 cm  Wound Surface Area (cm^2) 10.5 cm^2  Wound Volume (cm^3) 26.25 cm^3  Undermining (cm) 2.5 (from 10:00 to 3:00)  Margins Unattached edges (unapproximated)  Drainage Amount Minimal  Drainage Description Purulent  Treatment Cleansed;Debridement (Selective);Hydrotherapy (Pulse lavage);Packing (Saline gauze) (santyl)  Hydrotherapy  Pulsed lavage therapy - wound location right sacrum  Pulsed Lavage with Suction (psi) 4 psi  Pulsed Lavage with Suction - Normal Saline Used 500 mL  Pulsed Lavage Tip Tip with splash shield  Selective Debridement  Selective Debridement - Location right  sacral/gluteal area  Selective Debridement - Tools Used Forceps;Scissors  Selective Debridement - Tissue Removed slough  Wound Therapy - Assess/Plan/Recommendations  Wound Therapy - Clinical Statement The  patient arouses and does speak some words. Patient does indicate discomfort during PLS. Patient's daughter present and  provided  history: Patient went to ALF in August , has not ambulated since, sits in [facility] or more recently bed bound. The open wound is malaodorous with tan  drainage, brown and tan slough. a chunk of slough was debrided, no bleeding.  Patient will benefit from PT for PLS to decrease necrotic tissue, improve wound qualities and decrease  bioburden effects. Explained treatment to patient and daughter.   Wound Therapy - Functional Problem List non ambulatory  Factors Delaying/Impairing Wound Healing Incontinence;Immobility  Hydrotherapy Plan Debridement;Dressing change;Pulsatile lavage with suction;Patient/family education  Wound Therapy - Frequency 6X / week  Wound Therapy - Current Recommendations PT;Case manager/social work  Wound Therapy - Follow Up Recommendations Skilled nursing facility  Wound Plan PLS 6 x week with drebridement and dressing change  Wound Therapy Goals - Improve the function of patient's integumentary system by progressing the wound(s) through the phases of wound healing by:  Decrease Necrotic Tissue to 25  Decrease Necrotic Tissue - Progress Goal set today  Increase Granulation Tissue to 75  Increase Granulation Tissue - Progress Goal set today  Improve Drainage Characteristics Min  Improve Drainage Characteristics - Progress Goal set today  Goals/treatment plan/discharge plan were made with and agreed upon by patient/family Yes  Time For Goal Achievement 2 weeks  Wound Therapy - Potential for Goals Fair  Blanchard Kelch PT Acute Rehabilitation Services Pager [phone] Office [phone]

## 2020-05-09 NOTE — Progress Notes (Signed)
[facility] Progress Note  Patient: Melinda Love    JSE:[id number]  DOA: 05/07/2020     Date of Service: the patient was seen and examined on 05/09/2020  Brief hospital course: Past medical history of dementia, bedbound status and sacral ulcer presents with complaints of fatigue and tiredness.  Currently plan is currently on IV antibiotics for cellulitis.  Assessment and Plan: 1.  Cellulitis of second cubitus ulcer stage IV, POA No evidence of osteomyelitis. Ulcer actually appears to be healing but had some foul-smelling discharge. Continue wound care. Continue nutritional supplement. Continue IV antibiotics for now although low threshold to switch to oral antibiotics. Superficial culture growing staph aureus and Proteus.  Await sensitivity before transitioning. PT OT consulted.  2.  Alzheimer's dementia Acute metabolic encephalopathy Patient at her baseline communicates.  Back in June was able to ambulate without any difficulty. Now currently bedbound and confused. Etiology of this is not clear. We will initiate further work-up including CT scan of the head as well as metabolic work-up. Monitor. Monitor for sundowning. Discontinue secondary to medication that are scheduled and change them to as needed only.  3.  Hypokalemia Replacement sent monitor.  4.  Anemia Monitor.  Will require iron supplementation   5.  UTI. Urine culture growing E. coli. Currently on IV antibiotics.  Monitor sensitivity.  Pressure Injury 05/07/20 Sacrum Right Stage 4 - Full thickness tissue loss with exposed bone, tendon or muscle. (Active)  05/07/20 2240  Location: Sacrum  Location Orientation: Right  Staging: Stage 4 - Full thickness tissue loss with exposed bone, tendon or muscle.  Wound Description (Comments):   Present on Admission: Yes     Pressure Injury 05/09/20 Sacrum Right;Mid Stage 4 - Full thickness tissue loss with exposed bone, tendon or muscle. PT ONLY- wound is on the  right guteal area, above gluteal cleft.  (Active)  05/09/20 1100  Location: Sacrum  Location Orientation: Right;Mid  Staging: Stage 4 - Full thickness tissue loss with exposed bone, tendon or muscle.  Wound Description (Comments): PT ONLY- wound is on the right guteal area, above gluteal cleft.   Present on Admission: Yes     Diet: Dysphagia 1 diet DVT Prophylaxis: Subcutaneous Heparin    Advance goals of care discussion: Full code  Family Communication: no family was present at bedside, at the time of interview.   Disposition:  Status is: Inpatient  Remains inpatient appropriate because:Altered mental status   Dispo: The patient is from: SNF              Anticipated d/c is to: SNF              Anticipated d/c date is: 2 days              Patient currently is not medically stable to d/c.  Subjective: No nausea no vomiting.  No fever no chills.  No acute events overnight.  Mentation improving.  Able to follow commands.  Answering questions appropriately.  Physical Exam:  General: Appear in mild distress, no Rash; Oral Mucosa Clear, dry. no Abnormal Neck Mass Or lumps, Conjunctiva normal  Cardiovascular: S1 and S2 Present, no Murmur, Respiratory: Normal respiratory effort, Bilateral Air entry present and CTA, no Crackles, no wheezes Abdomen: Bowel Sound present, Soft and difficult to assess tenderness Extremities: no Pedal edema Neurology: Alert and awake.  Oriented x2.  No focal deficit. No asterixis.   Gait not checked due to patient safety concerns  Vitals:   05/08/20 1816 05/08/20 2010  05/09/20 0638 05/09/20 1348  BP: (!) 145/71 (!) 111/54 (!) 136/59 (!) 137/58  Pulse: 76 75 68 67  Resp: 14 18  18   Temp: (!) 97.5 F (36.4 C) 97.7 F (36.5 C) 98.2 F (36.8 C) 98.5 F (36.9 C)  TempSrc: Oral  Oral   SpO2: 95% 97% 94% 94%  Weight:      Height:        Intake/Output Summary (Last 24 hours) at 05/09/2020 2003 Last data filed at 05/09/2020 1820 Gross per 24 hour   Intake 100 ml  Output 1050 ml  Net -950 ml   Filed Weights   05/07/20 1418 05/07/20 2225  Weight: 69.2 kg 66.8 kg    Data Reviewed: I have personally reviewed and interpreted daily labs, tele strips, imagings as discussed above. I reviewed all nursing notes, pharmacy notes, vitals, pertinent old records I have discussed plan of care as described above with RN and patient/family.  CBC: Recent Labs  Lab 05/07/20 1715 05/08/20 0616  WBC 13.3* 13.9*  NEUTROABS 9.7* 11.1*  HGB 11.2* 11.3*  HCT 34.4* 34.2*  MCV 83.7 83.8  PLT 295 302   Basic Metabolic Panel: Recent Labs  Lab 05/07/20 1715 05/08/20 0616 05/09/20 0532  NA 137 141  --   K 3.0* 3.4*  --   CL 96* 99  --   CO2 30 30  --   GLUCOSE 91 101*  --   BUN 12 10  --   CREATININE 0.60 0.68 0.70  CALCIUM 8.8* 8.9  --   MG  --  1.9  --     Studies: No results found.  Scheduled Meds: . cholecalciferol  5,000 Units Oral Daily  . collagenase   Topical Daily  . donepezil  10 mg Oral QHS  . [START ON 05/10/2020] influenza vaccine adjuvanted  0.5 mL Intramuscular Tomorrow-1000  . melatonin  3 mg Oral QHS  . memantine  10 mg Oral Daily  . polyvinyl alcohol  1 drop Both Eyes BID  . risperiDONE  0.75 mg Oral QHS  . sertraline  125 mg Oral Daily  . vitamin B-12  500 mcg Oral Daily   Continuous Infusions: . sodium chloride 250 mL (05/09/20 1926)  . piperacillin-tazobactam (ZOSYN)  IV 3.375 g (05/09/20 1928)  . vancomycin 1,250 mg (05/09/20 1517)   PRN Meds: sodium chloride, acetaminophen, LORazepam, polyvinyl alcohol  Time spent: 35 minutes  Author: 14/06/21, MD [facility] 05/09/2020 8:03 PM  To reach On-call, see care teams to locate the attending and reach out via [email or web address]. Between 7PM-7AM, please contact night-coverage If you still have difficulty reaching the attending provider, please page the [facility] (Director on Call) for [facility] on amion for assistance.

## 2020-05-09 NOTE — Consult Note (Signed)
WOC Nurse wound consult note Consultation was completed by review of records, images and assistance from the bedside nurse/clinical staff.   Reason for Consult: sacral wound Patient from memory care unit that could not provide needed area.  Noted recommendations for NPWT (VAC) therapy however this would be contraindicated until the wound is less than 25% necrotic.  Wound type:Unstageable Pressure injury Pressure Injury POA: Yes Measurement: 3cm x 3cm x (need depth) requested nursing staff to measure and place on FS). Wound bed: pale, non granular, non healing over 75% grey non viable tissue Drainage (amount, consistency, odor) purulent with odor per nursing flow sheet Periwound: documented to have epibole of wound edges  Dressing procedure/placement/frequency: 1. Add air mattress replacement for moisture management and pressure redistribution 2. Add PT for hydrotherapy for mechanical debridement M-Saturdays 3. Dietician consulted at the time of admission for wound healing/supplementation 4. Enzymatic debridement ointment added in conjunction with hydrotherapy; apply a thick layer of Santyl to the wound bed, fluff fill with saline moist gauze, top with ABD pads, secure with tape. Change daily.   Discussed POC MD and bedside nurse via secure chat.  WOC nursing will follow along to manage hydrotherapy orders; collaborate with PT.  Thanks  Melinda Love [employer], RN,CWOCN, CNS, CWON-AP [phone])

## 2020-05-09 NOTE — Evaluation (Signed)
Physical Therapy Evaluation Patient Details Name: Melinda Love MRN: [id number] DOB: 03/16/1934 Today's Date: 05/09/2020   History of Present Illness  : Melinda Love is a 86 y.o. female with medical history significant for Alzheimers dementia and is unable to give history. History obtained from her daughter, Rock Nephew. She has been at [facility] unit since August and not ambulatory since this time. The facility noticed a wound on her sacrum on 11/19 and was sent to the [facility] wound care ceneter on 11/23  Clinical Impression  The patient  Is awake. Patient did assist to  Roll to each side. Max assistance to sit up on bed side. UNable to attempt standing/transfer with on 1 assisting. Patient will benefit from PT to improve mobility and decrease caregiver burden. Pt admitted with above diagnosis.   Pt currently with functional limitations due to the deficits listed below (see PT Problem List). Pt will benefit from skilled PT to increase their independence and safety with mobility to allow discharge to the venue listed below.       Follow Up Recommendations SNF    Equipment Recommendations  None recommended by PT    Recommendations for Other Services       Precautions / Restrictions Precautions Precautions: Fall Precaution Comments: right  glut/sacral wound      Mobility  Bed Mobility Overal bed mobility: Needs Assistance Bed Mobility: Rolling;Supine to Sit;Sit to Supine Rolling: Mod assist   Supine to sit: Max assist Sit to supine: Max assist   General bed mobility comments: multimodal cues to roll and reach for rail. Max assist to move legs over  edge, assist with trunk. Back to supine with max assist.    Transfers                 General transfer comment: NT  Ambulation/Gait                Stairs            Wheelchair Mobility    Modified Rankin (Stroke Patients Only)       Balance Overall balance assessment: Needs assistance Sitting-balance  support: Bilateral upper extremity supported;Feet supported Sitting balance-Leahy Scale: Poor   Postural control: Posterior lean                                   Pertinent Vitals/Pain Pain Assessment: No/denies pain    Home Living Family/patient expects to be discharged to:: Skilled nursing facility                      Prior Function           Comments: from [facility] care , was non ambulatory     Hand Dominance        Extremity/Trunk Assessment   Upper Extremity Assessment Upper Extremity Assessment: Generalized weakness    Lower Extremity Assessment Lower Extremity Assessment: Generalized weakness    Cervical / Trunk Assessment Cervical / Trunk Assessment: Normal  Communication   Communication: Expressive difficulties  Cognition Arousal/Alertness: Awake/alert Behavior During Therapy: WFL for tasks assessed/performed Overall Cognitive Status: History of cognitive impairments - at baseline                                        General Comments      Exercises  Assessment/Plan    PT Assessment Patient needs continued PT services  PT Problem List Decreased strength;Decreased mobility;Decreased safety awareness;Decreased knowledge of precautions;Decreased activity tolerance;Decreased cognition;Decreased balance       PT Treatment Interventions Therapeutic activities;Therapeutic exercise;Patient/family education;Functional mobility training    PT Goals (Current goals can be found in the Care Plan section)  Acute Rehab PT Goals Patient Stated Goal: none PT Goal Formulation: Patient unable to participate in goal setting Time For Goal Achievement: 05/23/20 Potential to Achieve Goals: Fair    Frequency Min 2X/week   Barriers to discharge        Co-evaluation               AM-PAC PT "6 Clicks" Mobility  Outcome Measure Help needed turning from your back to your side while in a flat bed without using  bedrails?: A Lot Help needed moving from lying on your back to sitting on the side of a flat bed without using bedrails?: A Lot Help needed moving to and from a bed to a chair (including a wheelchair)?: Total Help needed standing up from a chair using your arms (e.g., wheelchair or bedside chair)?: Total Help needed to walk in hospital room?: Total Help needed climbing 3-5 steps with a railing? : Total 6 Click Score: 8    End of Session   Activity Tolerance: Patient tolerated treatment well Patient left: in bed;with call bell/phone within reach;with bed alarm set Nurse Communication: Mobility status;Need for lift equipment PT Visit Diagnosis: Unsteadiness on feet (R26.81)    Time: 7062-3762 PT Time Calculation (min) (ACUTE ONLY): 15 min   Charges:   PT Evaluation $PT Eval Low Complexity: 1 Low          Blanchard Kelch PT Acute Rehabilitation Services Pager [phone] Office [phone]   Rada Hay 05/09/2020, 5:00 PM

## 2020-05-10 LAB — BASIC METABOLIC PANEL
Anion gap: 12 (ref 5–15)
BUN: 10 mg/dL (ref 8–23)
CO2: 28 mmol/L (ref 22–32)
Calcium: 8.8 mg/dL — ABNORMAL LOW (ref 8.9–10.3)
Chloride: 101 mmol/L (ref 98–111)
Creatinine, Ser: 0.8 mg/dL (ref 0.44–1.00)
GFR, Estimated: >60 mL/min (ref >60)
Glucose, Bld: 96 mg/dL (ref 70–99)
Potassium: 3.5 mmol/L (ref 3.5–5.1)
Sodium: 141 mmol/L (ref 135–145)

## 2020-05-10 LAB — URINE CULTURE
Culture: >=100000 — AB
Special Requests: NORMAL

## 2020-05-10 LAB — CREATININE, SERUM
Creatinine, Ser: 0.77 mg/dL (ref 0.44–1.00)
GFR, Estimated: >60 mL/min (ref >60)

## 2020-05-10 MED ORDER — ADULT MULTIVITAMIN W/MINERALS CH
1.0000 | ORAL_TABLET | Freq: Every day | ORAL | Status: DC
  Administered 2020-05-10 – 2020-05-16 (×7): 1 via ORAL
  Filled 2020-05-10 (×7): qty 1

## 2020-05-10 MED ORDER — PROSOURCE PLUS PO LIQD
30.0000 mL | Freq: Two times a day (BID) | ORAL | Status: DC
  Administered 2020-05-10 – 2020-05-16 (×11): 30 mL via ORAL
  Filled 2020-05-10 (×10): qty 30

## 2020-05-10 MED ORDER — CEFDINIR 300 MG PO CAPS
300.0000 mg | ORAL_CAPSULE | Freq: Two times a day (BID) | ORAL | Status: DC
  Administered 2020-05-10 – 2020-05-16 (×12): 300 mg via ORAL
  Filled 2020-05-10 (×12): qty 1

## 2020-05-10 MED ORDER — ENSURE ENLIVE PO LIQD
237.0000 mL | Freq: Two times a day (BID) | ORAL | Status: DC
  Administered 2020-05-11 – 2020-05-16 (×11): 237 mL via ORAL

## 2020-05-10 MED ORDER — ZINC SULFATE 220 (50 ZN) MG PO CAPS
220.0000 mg | ORAL_CAPSULE | Freq: Every day | ORAL | Status: DC
  Administered 2020-05-10 – 2020-05-16 (×7): 220 mg via ORAL
  Filled 2020-05-10 (×7): qty 1

## 2020-05-10 MED ORDER — ENOXAPARIN SODIUM 40 MG/0.4ML ~~LOC~~ SOLN
40.0000 mg | Freq: Every day | SUBCUTANEOUS | Status: DC
  Administered 2020-05-10 – 2020-05-16 (×7): 40 mg via SUBCUTANEOUS
  Filled 2020-05-10 (×7): qty 0.4

## 2020-05-10 MED ORDER — DOXYCYCLINE HYCLATE 100 MG PO TABS
100.0000 mg | ORAL_TABLET | Freq: Two times a day (BID) | ORAL | Status: DC
  Administered 2020-05-10 – 2020-05-16 (×12): 100 mg via ORAL
  Filled 2020-05-10 (×12): qty 1

## 2020-05-10 MED ORDER — ASCORBIC ACID 500 MG PO TABS
500.0000 mg | ORAL_TABLET | Freq: Every day | ORAL | Status: DC
  Administered 2020-05-10 – 2020-05-16 (×7): 500 mg via ORAL
  Filled 2020-05-10 (×7): qty 1

## 2020-05-10 NOTE — Care Management Important Message (Signed)
Important Message  Patient Details IM Letter given to the Patient. Name: Melinda Love MRN: [id number] Date of Birth: April 27, 1934   Medicare Important Message Given:  Yes     Caren Macadam 05/10/2020, 1:57 PM

## 2020-05-10 NOTE — TOC Initial Note (Addendum)
Transition of Care [facility]) - Initial/Assessment Note    Patient Details  Name: Melinda Love MRN: [id number] Date of Birth: 12-23-1933  Transition of [facility]) CM/SW Contact:    Darleene Cleaver, LCSW Phone Number: 05/10/2020, 6:03 PM  Clinical Narrative:                  Patient is an 86 year old from [facility] on [facility].  Patient has dementia, CSW had to speak to patient's daughter to complete assessment.  Plan is for patient to go to SNF under rehab and nursing benefits, and then transition to long term care.  Per patient's daughter she would like a facility in [place], CSW to begin bed search in [place] and [place].    Expected Discharge Plan: Skilled Nursing Facility Barriers to Discharge: Continued Medical Work up   Patient Goals and CMS Choice Patient states their goals for this hospitalization and ongoing recovery are:: To go to SNF for rehab, then transition to long term care CMS Medicare.gov Compare Post Acute Care list provided to:: Patient Represenative (must comment) Choice offered to / list presented to : Adult Children  Expected Discharge Plan and Services Expected Discharge Plan: Skilled Nursing Facility In-house Referral: Clinical Social Work   Post Acute Care Choice: Skilled Nursing Facility Living arrangements for the past 2 months: Assisted Living Facility (Memory Care)                                      Prior Living Arrangements/Services Living arrangements for the past 2 months: Assisted Living Facility (Memory Care) Lives with:: Facility Resident Patient language and need for interpreter reviewed:: Yes Do you feel safe going back to the place where you live?: No   Patient needs some rehab before she is able to return back to [facility] Memory [facility]  Need for Family Participation in Patient Care: Yes (Comment) Care giver support system in place?: No (comment)   Criminal Activity/Legal Involvement Pertinent  to Current Situation/Hospitalization: No - Comment as needed  Activities of Daily Living Home Assistive Devices/Equipment: None ADL Screening (condition at time of admission) Patient's cognitive ability adequate to safely complete daily activities?: No Is the patient deaf or have difficulty hearing?: Yes Does the patient have difficulty seeing, even when wearing glasses/contacts?: No Does the patient have difficulty concentrating, remembering, or making decisions?: Yes Patient able to express need for assistance with ADLs?: No Does the patient have difficulty dressing or bathing?: Yes Independently performs ADLs?: No Communication: Needs assistance Is this a change from baseline?: Pre-admission baseline Dressing (OT): Dependent Is this a change from baseline?: Change from baseline, expected to last <3days Grooming: Dependent Is this a change from baseline?: Pre-admission baseline Feeding: Dependent Is this a change from baseline?: Pre-admission baseline Bathing: Dependent Is this a change from baseline?: Pre-admission baseline Toileting: Dependent Is this a change from baseline?: Pre-admission baseline In/Out Bed: Dependent Is this a change from baseline?: Pre-admission baseline Walks in Home: Dependent Is this a change from baseline?: Pre-admission baseline Does the patient have difficulty walking or climbing stairs?: Yes Weakness of Legs: Both Weakness of Arms/Hands: Both  Permission Sought/Granted Permission sought to share information with : Facility Medical sales representative, Family Supports Permission granted to share information with : Yes, Verbal Permission Granted, Yes, Release of Information Signed  Share Information with NAME: Oralia Rud Daughter [phone]  [phone] or Wille Celeste  Granddaughter   [phone]  Permission granted to share info w AGENCY: SNF admissions        Emotional Assessment Appearance:: Appears stated age   Affect (typically  observed): Accepting, Calm, Appropriate, Stable Orientation: : Oriented to Self Alcohol / Substance Use: Not Applicable Psych Involvement: No (comment)  Admission diagnosis:  Sacral decubitus ulcer, stage IV (HCC) [L89.154] AMS (altered mental status) [R41.82] Stage 4 skin ulcer of sacral region [facility]) [L98.429] Patient Active Problem List   Diagnosis Date Noted  . Alzheimer's dementia (HCC) 05/08/2020  . Leukocytosis 05/08/2020  . Hypokalemia 05/08/2020  . Anemia 05/08/2020  . Sacral decubitus ulcer, stage IV (HCC) 05/07/2020   PCP:  Patient, No Pcp Per Pharmacy:  No Pharmacies Listed    Social Determinants of Health (SDOH) Interventions    Readmission Risk Interventions No flowsheet data found.

## 2020-05-10 NOTE — Evaluation (Addendum)
Occupational Therapy Evaluation Patient Details Name: Melinda Love MRN: [id number] DOB: Feb 07, 1934 Today's Date: 05/10/2020    History of Present Illness : Melinda Love is a 86 y.o. female with medical history significant for Alzheimers dementia and is unable to give history. History obtained from her daughter, Rock Nephew. She has been at [facility] unit since August and not ambulatory since this time. The facility noticed a wound on her sacrum on 11/19 and [facility]   Clinical Impression   PTA patient was residing at a memory care facility and per med chart was requiring assist with ADLs at baseline. Patient was not ambulatory. Patient is A&Ox0 and unable to provide history. No family present at bedside throughout evaluation to provide Hx or confirm patient cognition at baseline. Patient currently presents with deficits in sitting/standing balance, general decreased strength, and decreased cognition. Recommendation for SNF placement.     Follow Up Recommendations  SNF    Equipment Recommendations  None recommended by OT    Recommendations for Other Services       Precautions / Restrictions Precautions Precautions: Fall Precaution Comments: right glut/sacral wound Restrictions Weight Bearing Restrictions: No      Mobility Bed Mobility Overal bed mobility: Needs Assistance Bed Mobility: Supine to Sit     Supine to sit: Max assist;HOB elevated     General bed mobility comments: Increased time/effort and multimodal cues for sequencing.     Transfers Overall transfer level: Needs assistance Equipment used: 2 person hand held assist Transfers: Sit to/from [employer] Sit to Stand: Max assist;+2 physical assistance;+2 safety/equipment Stand pivot transfers: Max assist;+2 physical assistance;+2 safety/equipment;From elevated surface       General transfer comment: HHA +2 and Max A with maximal multiodal cues for sit  to stand from elevated EOB and for steps to recliner. Patient with difficulty extending knees/hips to neutral to stand up straight. Shuffle like steps only.      Balance Overall balance assessment: Needs assistance Sitting-balance support: Bilateral upper extremity supported;Feet supported Sitting balance-Leahy Scale: Poor Sitting balance - Comments: At least Min A to maintain sitting balance at EOB.  Postural control: Posterior lean   Standing balance-Leahy Scale: Poor Standing balance comment: +2 HHA to maintain standing balance.                            ADL either performed or assessed with clinical judgement   ADL Overall ADL's : Needs assistance/impaired Eating/Feeding: Moderate assistance;Sitting Eating/Feeding Details (indicate cue type and reason): Patient able to take sips from straw when offered cup of water.              Upper Body Dressing : Maximal assistance Upper Body Dressing Details (indicate cue type and reason): Max A to don posterior hospital gown seated EOB.  Lower Body Dressing: Maximal assistance Lower Body Dressing Details (indicate cue type and reason): Max A to don footwear in supine.              Functional mobility during ADLs: Maximal assistance;+2 for physical assistance;+2 for safety/equipment General ADL Comments: Max A +2 for shuffle like steps toward recliner      Vision Patient Visual Report: Other (comment) (Unable to assess 2/2 Hx of congitive deficits) Additional Comments: Patient able to turn head to track therapist in room.      Perception     Praxis      Pertinent Vitals/Pain  Pain Assessment: Faces Faces Pain Scale: Hurts a little bit Pain Location: With movement of LLE toward EOB.  Pain Descriptors / Indicators: Grimacing Pain Intervention(s): Monitored during session     Hand Dominance     Extremity/Trunk Assessment Upper Extremity Assessment Upper Extremity Assessment: Generalized weakness   Lower  Extremity Assessment Lower Extremity Assessment: Generalized weakness   Cervical / Trunk Assessment Cervical / Trunk Assessment: Normal   Communication Communication Communication: Expressive difficulties   Cognition Arousal/Alertness: Awake/alert Behavior During Therapy: WFL for tasks assessed/performed Overall Cognitive Status: History of cognitive impairments - at baseline                                 General Comments: Patient unable to state name or correctly identify name given multiple choice cues.    General Comments       Exercises     Shoulder Instructions      Home Living Family/patient expects to be discharged to:: Skilled nursing facility                                        Prior Functioning/Environment          Comments: from [facility] care , was non ambulatory        OT Problem List: Decreased strength;Decreased activity tolerance;Decreased range of motion;Impaired balance (sitting and/or standing);Decreased coordination;Decreased cognition;Pain      OT Treatment/Interventions: Self-care/ADL training;Therapeutic exercise;Energy conservation;DME and/or AE instruction;Therapeutic activities;Cognitive remediation/compensation;Patient/family education;Balance training    OT Goals(Current goals can be found in the care plan section) Acute Rehab OT Goals Patient Stated Goal: Unable  OT Goal Formulation: Patient unable to participate in goal setting Time For Goal Achievement: 05/24/20 ADL Goals Pt Will Perform Grooming: with min assist;sitting Pt Will Perform Upper Body Dressing: with min assist;sitting Pt Will Perform Lower Body Dressing: with mod assist;sit to/from stand Additional ADL Goal #1: Patient will demonstrate ability to attend to ADL task for more than 5 min with no more than Min multimodal cues.  OT Frequency: Min 2X/week   Barriers to D/C:            Co-evaluation              AM-PAC OT "6  Clicks" Daily Activity     Outcome Measure Help from another person eating meals?: A Lot Help from another person taking care of personal grooming?: A Lot Help from another person toileting, which includes using toliet, bedpan, or urinal?: Total Help from another person bathing (including washing, rinsing, drying)?: Total Help from another person to put on and taking off regular upper body clothing?: A Lot Help from another person to put on and taking off regular lower body clothing?: Total 6 Click Score: 9   End of Session Equipment Utilized During Treatment: Gait belt Nurse Communication: Mobility status (MD messaged for geomat 2/2 sacral wound)  Activity Tolerance: Patient tolerated treatment well Patient left: in chair;with chair alarm set;Other (comment) (Belt alarm activated )  OT Visit Diagnosis: Unsteadiness on feet (R26.81);Muscle weakness (generalized) (M62.81);Pain Pain - Right/Left: Left Pain - part of body: Leg (With movement toward EOB)                Time: 9735-3299 OT Time Calculation (min): 25 min Charges:  OT General Charges $OT Visit: 1 Visit OT Evaluation $OT Eval Moderate  Complexity: 1 Mod  Chazz Philson H. OTR/L Supplemental OT, Department of rehab services [phone]  Bindu Docter R H. 05/10/2020, 11:53 AM

## 2020-05-10 NOTE — Progress Notes (Signed)
Initial Nutrition Assessment  RD working remotely.  DOCUMENTATION CODES:   Not applicable  INTERVENTION:  - will order Ensure Enlive po BID, each supplement provides 350 kcal and 20 grams of protein - will order Magic cup TID with meals, each supplement provides 290 kcal and 9 grams of protein - will Will order 30 ml Prosource Plus BID, each supplement provides 100 kcal and 15 grams protein.  - will order 1 tablet multivitamin with minerals/day. - will 500 mg ascorbic acid/day and 220 mg zinc sulfate/day to aid in wound healing--ok with MD.    NUTRITION DIAGNOSIS:   Increased nutrient needs related to acute illness, wound healing as evidenced by estimated needs.  GOAL:   Patient will meet greater than or equal to 90% of their needs  MONITOR:   PO intake, Supplement acceptance, Labs, Weight trends  REASON FOR ASSESSMENT:   Consult Wound healing  ASSESSMENT:   86 y.o. female with medical history of Alzheimers dementia which has worsened in the past 2 months. She has resided at [facility] memory care unit since 01/2020 and is not ambulatory at baseline. On 11/19, facility staff noted a wound on her sacrum. She went to wound care center on 11/23 during which excision and debridement was done and wound was noted to be a stage 4. Facility was unable to provide interventions that were recommended by wound care center and recommended that patient transfer to a SNF. Daughter was unsure what to do so brought patient to the ED.  She is noted to be disoriented x4. No intakes have been documented since admission.   Weight on 12/4 was documented as both 147 lb and 153 lb. PTA, the most recently documented weight was on 11/22/19 when she weighed 166 lb. This indicates 13-19 lb weight loss (7.8-11.4% body weight) in the past 5.5 months.   Per notes: - hx of Alzheimer's dementia with current acute metabolic encephalopathy superimposed - hypokalemia on admission--now resolved - anemia - UTI -  cellulitis of sacral ulcer   Labs reviewed. Medications reviewed; 5000 units cholecalciferol/day, 3 mg melatonin/day, 500 mcg oral cyanocobalamin/day.     NUTRITION - FOCUSED PHYSICAL EXAM:  unable to complete at this time.   Diet Order:   Diet Order            DIET - DYS 1 Room service appropriate? Yes; Fluid consistency: Thin  Diet effective now                 EDUCATION NEEDS:   Not appropriate for education at this time  Skin:  Skin Assessment: Skin Integrity Issues: Skin Integrity Issues:: Stage IV Stage IV: R and mid-R sacrum  Last BM:  12/7 (type 6)  Height:   Ht Readings from Last 1 Encounters:  05/07/20 5\' 8"  (1.727 m)    Weight:   Wt Readings from Last 1 Encounters:  05/07/20 66.8 kg    Estimated Nutritional Needs:  Kcal:  1775-2000 kcal Protein:  85-100 grams Fluid:  >/= 1.8 L/day     14/04/21, MS, RD, LDN, CNSC Inpatient Clinical Dietitian RD pager # available in AMION  After hours/weekend pager # available in [facility]

## 2020-05-10 NOTE — Progress Notes (Signed)
Physical Therapy Treatment Patient Details Name: Melinda Love MRN: [id number] DOB: [birth year] Today's Date: 05/10/2020    History of Present Illness : Melinda Love is a 86 y.o. female with medical history significant for Alzheimers dementia and is unable to give history. History obtained from her daughter, Rock Nephew. She has been at [facility] unit since August and not ambulatory since this time. The facility noticed a wound on her sacrum on 11/19 and was sent to the [facility] wound care c[facility]eter on 11/23    PT Comments    Pt OOB in recliner via OT.  Assisted back to bed for HYDROTHERAPY.   Sit to sidelying: Total assist;+2 for physical assistance;+2 for safety/equipment.  General transfer comment: Used "bear Hug" frontal approach to assist pt from recliner back to bed.  B hips and knees flex.  Pt unable to support her self.   Follow Up Recommendations  SNF     Equipment Recommendations  None recommended by PT    Recommendations for Other Services       Precautions / Restrictions Precautions Precautions: Fall Precaution Comments: right glut/sacral wound  Stage IV, Hx advanced Dementia Restrictions Weight Bearing Restrictions: No    Mobility  Bed Mobility Overal bed mobility: Needs Assistance Bed Mobility: Rolling;Sit to Sidelying Rolling: Total assist;+2 for safety/equipment;+2 for physical assistance   Supine to sit: Max assist;HOB elevated   Sit to sidelying: Total assist;+2 for physical assistance;+2 for safety/equipment General bed mobility comments: pt required Total Assist to get back to bed and position to LEFT sidlying for [facility] therapy  Transfers Overall transfer level: Needs assistance Equipment used: 2 person hand held assist Transfers: Stand Pivot Transfers Sit to Stand: Total assist Stand pivot transfers: Total assist       General transfer comment: Used "bear Hug" frontal approach to assist pt from recliner back to bed.  B hips and knees flex.  Pt unable  to support her self.  Ambulation/Gait             General Gait Details: unable   Stairs             Wheelchair Mobility    Modified Rankin (Stroke Patients Only)       Balance Overall balance assessment: Needs assistance Sitting-balance support: Bilateral upper extremity supported;Feet supported Sitting balance-Leahy Scale: Poor Sitting balance - Comments: At least Min A to maintain sitting balance at EOB.  Postural control: Posterior lean   Standing balance-Leahy Scale: Poor Standing balance comment: +2 HHA to maintain standing balance.                             Cognition Arousal/Alertness: Awake/alert Behavior During Therapy: Flat affect Overall Cognitive Status: History of cognitive impairments - at baseline                                 General Comments: pleasantly confused and non verbal expect few words      Exercises      General Comments        Pertinent Vitals/Pain Pain Assessment: Faces Faces Pain Scale: Hurts a little bit Pain Location: With movement general Pain Descriptors / Indicators: Grimacing Pain Intervention(s): Monitored during session;Repositioned    Home Living Family/patient expects to be discharged to:: Skilled nursing facility                    Prior  Function        Comments: from [facility] care , was non ambulatory   PT Goals (current goals can now be found in the care plan section) Acute Rehab PT Goals Patient Stated Goal: Unable  Progress towards PT goals: Progressing toward goals    Frequency    Min 2X/week      PT Plan Current plan remains appropriate    Co-evaluation              AM-PAC PT "6 Clicks" Mobility   Outcome Measure  Help needed turning from your back to your side while in a flat bed without using bedrails?: Total Help needed moving from lying on your back to sitting on the side of a flat bed without using bedrails?: Total Help needed moving to  and from a bed to a chair (including a wheelchair)?: Total Help needed standing up from a chair using your arms (e.g., wheelchair or bedside chair)?: Total Help needed to walk in hospital room?: Total Help needed climbing 3-5 steps with a railing? : Total 6 Click Score: 6    End of Session Equipment Utilized During Treatment: Gait belt Activity Tolerance: Patient tolerated treatment well Patient left: in bed;with call bell/phone within reach;with bed alarm set Nurse Communication: Mobility status;Need for lift equipment PT Visit Diagnosis: Unsteadiness on feet (R26.81)     Time: 9509-3267 PT Time Calculation (min) (ACUTE ONLY): 14 min  Charges:  $Therapeutic Activity: 8-22 mins                     Felecia Shelling  PTA Acute  Rehabilitation Services Pager      [phone] Office      [phone]

## 2020-05-10 NOTE — TOC Progression Note (Addendum)
Transition of Care [facility]) - Progression Note    Patient Details  Name: Melinda Love MRN: [id number] Date of Birth: 05/04/1934  Transition of Care [facility]) CM/SW Contact  Darleene Cleaver, [place] Phone Number: 05/10/2020, 5:50 PM  Clinical Narrative:    3:45pm  CSW Spoke to patient's granddaughter Melinda Love [phone], and provided bed offers.  Patient's granddaughter said she would contact patient's daughter Melinda Love [phone] to have her call CSW to discuss bed offers.  4:30pm  CSW received phone call from patient's daughter.  CSW discussed SNF placement options, what to expect at SNF and process to get transferred to SNF.  CSW talked with patient's daughter about long term care placement at SNF options after rehab.  Patient's daughter was concerned about how much it will cost to have patient at a SNF for long term care.  CSW explained to her that most people apply for long term care Medicaid which helps pay for stay at SNF.  CSW explained that it takes 30-45 days for Medicaid to be approved.  CSW informed her that the social worker at SNF can assist with applying for Medicaid.  CSW informed her that patients go to SNF under rehab benefits and receive therapy first before it is determined if patient needs LTC at a SNF.  CSW provided patient's daughter with choices, she stated she would rather patient go to [place] or [place] area because this is where patient's daughter lives.  CSW was informed that they would prefer [facility] if possible, CSW will call SNF and also several other SNFs in [place] and [place] area.  Patient has received her Covid Vaccine in February, [facility] on [address] has copies of vaccine record.  Patient's daughter stated that patient was ambulatory at the beginning of the summer, but then once she moved to memory care August 1 patient has not been able to walk since then.  Per patient's daughter, patient is in a wheelchair most of the day.   Patient's daughter stated that she is very upset with the care patient was getting at memory care ALF.  Per patient's daughter, memory care ALF can no longer meet the needs of the patient and she needs to go to SNF.  Patient's daughter asked about how to choose a facility, CSW referred her to Medicare.gov and google reviews to see what has been said about different facilities.  CSW informed daughter that this CSW can not give any recommendations for SNFs.  Patient's daughter was very appreciative of information given.  CSW to follow up with different SNFs tomorrow to see which facility has beds available and can accept patient.    Expected Discharge Plan: Skilled Nursing Facility Barriers to Discharge: Insurance Authorization  Expected Discharge Plan and Services Expected Discharge Plan: Skilled Nursing Facility       Living arrangements for the past 2 months: Assisted Living Facility (Memory Care)                                       Social Determinants of Health (SDOH) Interventions    Readmission Risk Interventions No flowsheet data found.

## 2020-05-10 NOTE — Progress Notes (Signed)
[facility] Progress Note  Patient: Melinda Love    ZOX:[id number]  DOA: 05/07/2020     Date of Service: the patient was seen and examined on 05/10/2020  Brief hospital course: Past medical history Alzheimer's dementia, chronic bedbound status, depression and stage IV sacral ulcer presents with complaints of fatigue and tiredness found to have cellulitis of the stage IV ulcer along with acute metabolic encephalopathy from polypharmacy. Currently plan is continue antibiotics, hydrotherapy and identify placement.  Assessment and Plan: 1.  Cellulitis of the stage IV sacral decubitus ulcer, POA. E. coli UTI X-ray pelvis negative for osteomyelitis. PT and wound care consulted currently receiving hydrotherapy. Ulcer appears to be improving. Continue wound care and nutritional supplementation. Appreciate dietary consultation as well. Patient was initially on IV vancomycin and Zosyn. Superficial culture is positive for MRSA, E. coli and Proteus. Currently we will change the antibiotics to doxycycline and Omnicef. Blood cultures negative.  2.  Alzheimer's dementia. Acute metabolic/toxic encephalopathy from polypharmacy and infection Few months ago patient was able to ambulate and communicate. Now bedbound and confused. Metabolic work-up including B1, B12, folic acid, TSH, free T4, ammonia unremarkable. Prior to admission Patient was on scheduled Ativan, BuSpar, higher dose of Requip and trazodone. Currently these medications are on hold. Ativan is as needed. Continue Zoloft, continue lower dose of Risperdal. Continue Namenda and Aricept as well. Patient appears to be having delirium secondary to infection. Speech therapy consulted currently on dysphagia 1 diet but currently requires significant assistance with feeding due to confusion. Continue current medication regimen and monitor. Monitor for withdrawal from benzodiazepines though.  3.  Hypokalemia Replaced.  4.  Iron  deficiency anemia No active bleeding. Likely nutritional deficiency. Hemoglobin stable. Initiate iron supplementation.  5.  Depression Continue Zoloft.  Monitor.  Body mass index is 22.39 kg/m.  Nutrition Problem: Increased nutrient needs Etiology: acute illness, wound healing Interventions: Interventions: Prostat, Ensure Enlive (each supplement provides 350kcal and 20 grams of protein), MVI, Magic cup  Pressure Injury 05/07/20 Sacrum Right Stage 4 - Full thickness tissue loss with exposed bone, tendon or muscle. (Active)  05/07/20 2240  Location: Sacrum  Location Orientation: Right  Staging: Stage 4 - Full thickness tissue loss with exposed bone, tendon or muscle.  Wound Description (Comments):   Present on Admission: Yes     Pressure Injury 05/09/20 Sacrum Right;Mid Stage 4 - Full thickness tissue loss with exposed bone, tendon or muscle. PT ONLY- wound is on the right guteal area, above gluteal cleft.  (Active)  05/09/20 1100  Location: Sacrum  Location Orientation: Right;Mid  Staging: Stage 4 - Full thickness tissue loss with exposed bone, tendon or muscle.  Wound Description (Comments): PT ONLY- wound is on the right guteal area, above gluteal cleft.   Present on Admission: Yes     Diet: Dysphagia 1 diet. DVT Prophylaxis:   enoxaparin (LOVENOX) injection 40 mg Start: 05/10/20 1400    Advance goals of care discussion: DNR  12/7 based on discussion with daughter.  Family Communication: mp family was present at bedside, at the time of interview.  Discussed with daughter on the phone. Opportunity was given to ask question and all questions were answered satisfactorily.   Disposition:  Status is: Inpatient  Remains inpatient appropriate because:Unsafe d/c plan and Inpatient level of care appropriate due to severity of illness   Dispo: The patient is from: ALF              Anticipated d/c is to: SNF  Anticipated d/c date is: 2 days              Patient  currently is not medically stable to d/c.        Subjective: No nausea no vomiting.  Confused.  Follows command though.  No fever no chills.  Physical Exam:  General: Appear in mild distress, no Rash; Oral Mucosa Clear, moist. no Abnormal Neck Mass Or lumps, Conjunctiva normal  Cardiovascular: S1 and S2 Present, no Murmur, Respiratory: good respiratory effort, Bilateral Air entry present and CTA, no Crackles, no wheezes Abdomen: Bowel Sound present, Soft and no tenderness Extremities: no Pedal edema Neurology: alert and not oriented to time, place, and person affect appropriate. no new focal deficit Gait not checked due to patient safety concerns  Vitals:   05/09/20 2028 05/10/20 0509 05/10/20 0800 05/10/20 1307  BP: (!) 151/61 (!) 154/55 124/63 (!) 159/56  Pulse: 71 70 69 69  Resp: 18 18 19 20   Temp: 98.1 F (36.7 C) 97.7 F (36.5 C) 97.7 F (36.5 C) (!) 97.5 F (36.4 C)  TempSrc: Oral  Oral Oral  SpO2: 97% 97% 93% 95%  Weight:      Height:        Intake/Output Summary (Last 24 hours) at 05/10/2020 1947 Last data filed at 05/10/2020 1500 Gross per 24 hour  Intake 824.45 ml  Output 500 ml  Net 324.45 ml   Filed Weights   05/07/20 1418 05/07/20 2225  Weight: 69.2 kg 66.8 kg    Data Reviewed: I have personally reviewed and interpreted daily labs, tele strips, imagings as discussed above. I reviewed all nursing notes, pharmacy notes, vitals, pertinent old records I have discussed plan of care as described above with RN and patient/family.  CBC: Recent Labs  Lab 05/07/20 1715 05/08/20 0616  WBC 13.3* 13.9*  NEUTROABS 9.7* 11.1*  HGB 11.2* 11.3*  HCT 34.4* 34.2*  MCV 83.7 83.8  PLT 295 302   Basic Metabolic Panel: Recent Labs  Lab 05/07/20 1715 05/08/20 0616 05/09/20 0532 05/10/20 0549 05/10/20 0853  NA 137 141  --   --  141  K 3.0* 3.4*  --   --  3.5  CL 96* 99  --   --  101  CO2 30 30  --   --  28  GLUCOSE 91 101*  --   --  96  BUN 12 10  --    --  10  CREATININE 0.60 0.68 0.70 0.77 0.80  CALCIUM 8.8* 8.9  --   --  8.8*  MG  --  1.9  --   --   --     Studies: No results found.  Scheduled Meds:  (feeding supplement) PROSource Plus  30 mL Oral BID BM   vitamin C  500 mg Oral Daily   cefdinir  300 mg Oral Q12H   cholecalciferol  5,000 Units Oral Daily   collagenase   Topical Daily   donepezil  10 mg Oral QHS   doxycycline  100 mg Oral Q12H   enoxaparin (LOVENOX) injection  40 mg Subcutaneous Daily   feeding supplement  237 mL Oral BID BM   melatonin  3 mg Oral QHS   memantine  10 mg Oral Daily   multivitamin with minerals  1 tablet Oral Daily   polyvinyl alcohol  1 drop Both Eyes BID   risperiDONE  0.75 mg Oral QHS   sertraline  125 mg Oral Daily   vitamin B-12  500  mcg Oral Daily   zinc sulfate  220 mg Oral Daily   Continuous Infusions:  sodium chloride 250 mL (05/09/20 1926)   PRN Meds: sodium chloride, acetaminophen, LORazepam, polyvinyl alcohol  Time spent: 35 minutes  Author: Lynden Oxford, MD [facility] 05/10/2020 7:47 PM  To reach On-call, see care teams to locate the attending and reach out via [email or web address]. Between 7PM-7AM, please contact night-coverage If you still have difficulty reaching the attending provider, please page the [facility] (Director on Call) for [facility] on amion for assistance.

## 2020-05-10 NOTE — Progress Notes (Signed)
   05/10/20 1300  Subjective Assessment  Subjective   Patient and Family Stated Goals per eval, to heal wound, walk again.  Date of Onset  (present on admission)  Prior Treatments seen in Wound clinic, dressing changes   Evaluation and Treatment  Evaluation and Treatment Procedures Explained to Patient/Family Yes  Evaluation and Treatment Procedures agreed to (daughter)  Pressure Injury 05/09/20 Sacrum Right;Mid Stage 4 - Full thickness tissue loss with exposed bone, tendon or muscle. PT ONLY- wound is on the right guteal area, above gluteal cleft.   Date First Assessed/Time First Assessed: 05/09/20 1100   Location: Sacrum  Location Orientation: Right;Mid  Staging: Stage 4 - Full thickness tissue loss with exposed bone, tendon or muscle.  Wound Description (Comments): PT ONLY- wound is on the righ...  Dressing Type ABD;Gauze (Comment)  Dressing Changed  Dressing Change Frequency Daily  State of Healing Early/partial granulation  Site / Wound Assessment Granulation tissue  % Wound base Red or Granulating 0%  % Wound base Yellow/Fibrinous Exudate 50%  Peri-wound Assessment Intact  Margins Unattached edges (unapproximated)  Drainage Amount Minimal  Treatment Cleansed  Hydrotherapy  Pulsed lavage therapy - wound location right sacrum  Pulsed Lavage with Suction (psi) 4 psi  Pulsed Lavage with Suction - Normal Saline Used 500 mL  Pulsed Lavage Tip Tip with splash shield  Selective Debridement  Selective Debridement - Location right sacral/gluteal area  Selective Debridement - Tools Used Forceps;Scissors  Selective Debridement - Tissue Removed slough  Wound Therapy - Assess/Plan/Recommendations  Wound Therapy - Clinical Statement The  patient arouses and does speak some words.  No c/o pain.  Did grimace with reaction to cold saline spray.  Tolerated Hydro session well.    Wound Therapy - Functional Problem List non ambulatory  Factors Delaying/Impairing Wound Healing  Incontinence;Immobility  Hydrotherapy Plan Debridement;Dressing change;Pulsatile lavage with suction;Patient/family education  Wound Therapy - Frequency 6X / week  Wound Therapy - Current Recommendations PT;Case manager/social work  Wound Therapy - Follow Up Recommendations Skilled nursing facility  Wound Plan PLS 6 x week with drebridement and dressing change  Wound Therapy Goals - Improve the function of patient's integumentary system by progressing the wound(s) through the phases of wound healing by:  Decrease Necrotic Tissue to 25  Increase Granulation Tissue to 75  Improve Drainage Characteristics Min  Goals/treatment plan/discharge plan were made with and agreed upon by patient/family Yes  Time For Goal Achievement 2 weeks  Wound Therapy - Potential for Goals Fair   Felecia Shelling  PTA Acute  Rehabilitation Services Pager      [phone] Office      [phone]

## 2020-05-11 DIAGNOSIS — L98429 Non-pressure chronic ulcer of back with unspecified severity: Secondary | ICD-10-CM

## 2020-05-11 DIAGNOSIS — N390 Urinary tract infection, site not specified: Secondary | ICD-10-CM

## 2020-05-11 DIAGNOSIS — G9341 Metabolic encephalopathy: Secondary | ICD-10-CM | POA: Diagnosis present

## 2020-05-11 DIAGNOSIS — F0281 Dementia in other diseases classified elsewhere with behavioral disturbance: Secondary | ICD-10-CM

## 2020-05-11 DIAGNOSIS — G309 Alzheimer's disease, unspecified: Secondary | ICD-10-CM

## 2020-05-11 DIAGNOSIS — L03818 Cellulitis of other sites: Secondary | ICD-10-CM

## 2020-05-11 DIAGNOSIS — D649 Anemia, unspecified: Secondary | ICD-10-CM

## 2020-05-11 DIAGNOSIS — B9620 Unspecified Escherichia coli [E. coli] as the cause of diseases classified elsewhere: Secondary | ICD-10-CM

## 2020-05-11 LAB — AEROBIC CULTURE W GRAM STAIN (SUPERFICIAL SPECIMEN): Special Requests: NORMAL

## 2020-05-11 LAB — CREATININE, SERUM
Creatinine, Ser: 0.81 mg/dL (ref 0.44–1.00)
GFR, Estimated: >60 mL/min (ref >60)

## 2020-05-11 MED ORDER — PNEUMOCOCCAL VAC POLYVALENT 25 MCG/0.5ML IJ INJ
0.5000 mL | INJECTION | INTRAMUSCULAR | Status: AC
  Administered 2020-05-12: 0.5 mL via INTRAMUSCULAR
  Filled 2020-05-11: qty 0.5

## 2020-05-11 NOTE — Plan of Care (Signed)
  Problem: Clinical Measurements: Goal: Ability to maintain clinical measurements within normal limits will improve Outcome: Progressing Goal: Diagnostic test results will improve Outcome: Progressing   

## 2020-05-11 NOTE — TOC Progression Note (Signed)
Transition of Care [facility]) - Progression Note    Patient Details  Name: Melinda Love MRN: [id number] Date of Birth: [birth year]  [facility] of [facility]) CM/SW Contact  Darleene Cleaver, [place] Phone Number: 05/11/2020, 9:37 PM  Clinical Narrative:     CSW faxed SNF referral to SNFs in [place], and [place] per daughter's request.   [facility] accepted [facility] accepted [facility] accepted [facility] accepted [facility], accepted [facility] and [address], accepted [facility] accepted  [facility], still pending  [facility] still pending [facility] still pending  [facility], declined [facility], declined [facility] [employer] declined  CSW presented bed offers to daughter, and she does not want patient to go to any of facilities that offered a bed for her.  CSW explained to her that she may have to go to one of the facilities that have offered even though it is not her first choice.  CSW explained that once patient is medically ready for discharge, she will have accept one of the offers.  Patient's daughter would like CSW to check [facility], [place], and [place] for bed offers.  CSW to fax out to facilities in other counties, waiting for response back.    Expected Discharge Plan: Skilled Nursing Facility Barriers to Discharge: Continued Medical Work up  Expected Discharge Plan and Services Expected Discharge Plan: Skilled Nursing Facility In-house Referral: Clinical Social Work   Post Acute Care Choice: Skilled Nursing Facility Living arrangements for the past 2 months: Assisted Living Facility (Memory Care)                                       Social Determinants of Health (SDOH) Interventions    Readmission Risk Interventions No flowsheet data found.

## 2020-05-11 NOTE — Progress Notes (Signed)
PHYSICAL THERAPY HYDROTHERAPY  05/11/20 1216 05/11/20 1220  Subjective Assessment  Subjective pt more alert and stating sentences "I have to pee" That hurts"  --   Evaluation and Treatment  Evaluation and Treatment Procedures Explained to Patient/Family Yes   Evaluation and Treatment Procedures agreed to  --   Pressure Injury 05/07/20 Sacrum Right Stage 4 - Full thickness tissue loss with exposed bone, tendon or muscle.  Date First Assessed/Time First Assessed: 05/07/20 2240   Location: (c) Sacrum  Location Orientation: Right  Staging: Stage 4 - Full thickness tissue loss with exposed bone, tendon or muscle.  Present on Admission: Yes  Dressing Type Moist to dry  --   Dressing Intact  -- soaked with urine  Dressing Change Frequency Daily  --   Pressure Injury 05/09/20 Sacrum Right;Mid Stage 4 - Full thickness tissue loss with exposed bone, tendon or muscle. PT ONLY- wound is on the right guteal area, above gluteal cleft.   Date First Assessed/Time First Assessed: 05/09/20 1100   Location: Sacrum  Location Orientation: Right;Mid  Staging: Stage 4 - Full thickness tissue loss with exposed bone, tendon or muscle.  Wound Description (Comments): PT ONLY- wound is on the righ...  Dressing Type ABD;Gauze (Comment)  --   Dressing Changed  --   Dressing Change Frequency Daily  --   State of Healing Early/partial granulation  --   Site / Wound Assessment Granulation tissue  --   % Wound base Red or Granulating 0%  --   % Wound base Yellow/Fibrinous Exudate 75%  --   Hydrotherapy  Pulsed lavage therapy - wound location right sacrum right sacrum  Pulsed Lavage with Suction (psi)  --  4 psi  Pulsed Lavage with Suction - Normal Saline Used  --  500 mL  Pulsed Lavage Tip  --  Tip with splash shield  Selective Debridement  Selective Debridement - Location  --  right sacral/gluteal area  Selective Debridement - Tools Used  --  Forceps;Scissors  Selective Debridement - Tissue Removed  --  slough  Wound  Therapy - Assess/Plan/Recommendations  Wound Therapy - Clinical Statement  --  The  patient arouses and does speak some words. Patient does indicate discomfort during PLS. Patient's daughter present and  provided  history: Patient went to ALF in August , has not ambulated since, sits in [employer] or more recently bed bound. The open wound is malaodorous with tan  drainage, brown and tan slough. a chunk of slough was debrided, no bleeding.  Patient will benefit from PT for PLS to decrease necrotic tissue, improve wound qualities and decrease  bioburden effects. Explained treatment to patient and daughter.   Wound Therapy - Functional Problem List  --  non ambulatory  Factors Delaying/Impairing Wound Healing  --  Incontinence;Immobility  Hydrotherapy Plan  --  Debridement;Dressing change;Pulsatile lavage with suction;Patient/family education  Wound Therapy - Frequency  --  6X / week  Wound Therapy - Current Recommendations  --  PT;Case manager/social work  Wound Therapy - Follow Up Recommendations  --  Skilled nursing facility  Wound Plan  --  PLS 6 x week with drebridement and dressing change  Wound Therapy Goals - Improve the function of patient's integumentary system by progressing the wound(s) through the phases of wound healing by:  Decrease Necrotic Tissue to  --  25  Increase Granulation Tissue to  --  75  Improve Drainage Characteristics  --  Min  Goals/treatment plan/discharge plan were made with and agreed upon  by patient/family  --  Yes  Time For Goal Achievement  --  2 weeks   Felecia Shelling  PTA Acute  Rehabilitation Services Pager      [phone] Office      [phone]

## 2020-05-11 NOTE — Progress Notes (Addendum)
[facility]  PROGRESS NOTE  Melinda Love YBO:[id number] DOB: 27-Dec-1933 DOA: 05/07/2020 PCP: Patient, No Pcp Per Admit date - 05/07/2020   Admitting Physician Orland Mustard, MD  Outpatient Primary MD for the patient is Patient, No Pcp Per  LOS - 4 Brief Narrative  Ms. Cadenhead is a 86 year old female with medical history significant for Alzheimer's dementia, Sacral pressure injury being managed at her memory care unit at [facility], during evaluation at [facility] wound care center on 11/23 staged as stage IV with recommendations of wet-to-dry dressing and negative pressure wound therapy via wound VAC. Per daughter the wound recommendations were not being followed,  daughter brought her to ER due to concerns of it appearing worse.  During hospital course has remained afebrile hemodynamically stable, blood cultures unremarkable has slight white count of 13.3 on admission otherwise no signs or symptoms of sepsis UA was positive for nitrites and urine culture with E. coli (only resistant to ampicillin and Unasyn).  Superficial culture of sacral ulcer grew MRSA, E. coli, Proteus mirabilis.  Pelvic x-ray showed no evidence of osteomyelitis of mid to lower sacrum, chest x-ray unremarkable.  Patient was started on vancomycin and cefazolin before deescalation to cefdinir and doxycycline  Subjective  Today she denies any pain in her lower sacrum.  Knows she is in the hospital.  A & P  Sacral pressure ulcer stage IV, present on admission.  Over 75% nonviable tissue based on wound evaluation on 12/6Patient tolerating hydrotherapy by physical therapy (pulsatile lavage and suction) for continued debridement.  Pelvis x-ray on admission showed no evidence of osteomyelitis. But still having malodorous drainage -Air mattress -Continue hydrotherapy by PT daily-continue following wound healing/supplementation recommended by dietary -Continue Santyl for enzymatic debridement per wound  recommendations  Cellulitis of sacral ulcer.  Osteomyelitis ruled out on admission.  With imaging and inability to probe to bone.  Had mild leukocytosis on admission otherwise has remained afebrile and no signs of sepsis.  Blood cultures are unremarkable.  Wound cultures grew MRSA, E. coli, Proteus -Currently tolerating cefdinir and p.o. doxycycline --low threshold to increase back to IV  Acute metabolic encephalopathy, greatly improved. Likely being most driven from infection in setting of known dementia. Much improved today now alert to self and place. Following commands --continue antibiotics --delirium precautions  E.Coli UTI. No current symptoms.  --continue cefdinir  Alzheimer's dementia with behavior disturbances, stable.  Alert to self and place this a.m. which is improved given concern for acute metabolic encephalopathy in the setting of infection due to confusion on admission -Continue Zoloft, Risperdal, Aricept, Namenda -Delirium precautions  B12 deficiency -Continue vitamin B12, multivitamin  Normocytic anemia. Iron panel seems consistent with anemia of chronic disease ( elevated ferritin, low iron). B12 and folate wnl. Hgb stable at 11. No current bleeding  Non ambulatory. Since August of this year has been wheelchair dependent . Required 2 person assist per PT evaluation.  -PT recommends SNF      Family Communication  : Spoke with daughter Oralia Rud, [phone] (Mobile  Code Status : DNR, discussed on day of admission  Disposition Plan  :  Patient is from memory care unit. Anticipated d/c date: 2 to 3 days. Barriers to d/c or necessity for inpatient status:  Needs daily PT hydrotherapy for continued debridement of pressure ulcer. PT recommends SNF Consults  : Wound care  Procedures  : None  DVT Prophylaxis  :  Lovenox  MDM: The below labs and imaging reports were reviewed and summarized above.  Medication management as above.  Lab Results  Component  Value Date   PLT 302 05/08/2020    Diet :  Diet Order            DIET - DYS 1 Room service appropriate? Yes; Fluid consistency: Thin  Diet effective now                  Inpatient Medications Scheduled Meds: . (feeding supplement) PROSource Plus  30 mL Oral BID BM  . vitamin C  500 mg Oral Daily  . cefdinir  300 mg Oral Q12H  . cholecalciferol  5,000 Units Oral Daily  . collagenase   Topical Daily  . donepezil  10 mg Oral QHS  . doxycycline  100 mg Oral Q12H  . enoxaparin (LOVENOX) injection  40 mg Subcutaneous Daily  . feeding supplement  237 mL Oral BID BM  . melatonin  3 mg Oral QHS  . memantine  10 mg Oral Daily  . multivitamin with minerals  1 tablet Oral Daily  . [START ON 05/12/2020] pneumococcal 23 valent vaccine  0.5 mL Intramuscular Tomorrow-1000  . polyvinyl alcohol  1 drop Both Eyes BID  . risperiDONE  0.75 mg Oral QHS  . sertraline  125 mg Oral Daily  . vitamin B-12  500 mcg Oral Daily  . zinc sulfate  220 mg Oral Daily   Continuous Infusions: . sodium chloride 250 mL (05/09/20 1926)   PRN Meds:.sodium chloride, acetaminophen, LORazepam, polyvinyl alcohol  Antibiotics  :   Anti-infectives (From admission, onward)   Start     Dose/Rate Route Frequency Ordered Stop   05/10/20 2200  cefdinir (OMNICEF) capsule 300 mg        300 mg Oral Every 12 hours 05/10/20 1906     05/10/20 2200  doxycycline (VIBRA-TABS) tablet 100 mg        100 mg Oral Every 12 hours 05/10/20 1906     05/08/20 1200  vancomycin (VANCOREADY) IVPB 1250 mg/250 mL  Status:  Discontinued        1,250 mg 166.7 mL/hr over 90 Minutes Intravenous Every 24 hours 05/08/20 0143 05/10/20 1905   05/08/20 0200  piperacillin-tazobactam (ZOSYN) IVPB 3.375 g  Status:  Discontinued        3.375 g 12.5 mL/hr over 240 Minutes Intravenous Every 8 hours 05/08/20 0143 05/10/20 1905   05/07/20 1815  ceFAZolin (ANCEF) IVPB 1 g/50 mL premix        1 g 100 mL/hr over 30 Minutes Intravenous  Once 05/07/20 1812  05/07/20 1900   05/07/20 1815  vancomycin (VANCOCIN) IVPB 1000 mg/200 mL premix        1,000 mg 200 mL/hr over 60 Minutes Intravenous  Once 05/07/20 1814 05/07/20 2037       Objective   Vitals:   05/10/20 1307 05/10/20 2159 05/11/20 0445 05/11/20 1325  BP: (!) 159/56 (!) 135/96 (!) 139/55 (!) 118/54  Pulse: 69 71 67 66  Resp: 20 18 18 20   Temp: (!) 97.5 F (36.4 C) 98.7 F (37.1 C) 98.1 F (36.7 C) 98 F (36.7 C)  TempSrc: Oral Oral Oral Oral  SpO2: 95% 95% 95% 95%  Weight:      Height:        SpO2: 95 %  Wt Readings from Last 3 Encounters:  05/07/20 66.8 kg  11/22/19 75.7 kg  10/07/17 63 kg     Intake/Output Summary (Last 24 hours) at 05/11/2020 1452 Last data filed at 05/11/2020 14/01/2020 Gross  per 24 hour  Intake 524.45 ml  Output 1000 ml  Net -475.55 ml    Physical Exam:  Awake, alert to self and place, in no acute distress Normal respiratory effort on room air Regular rate and rhythm, no peripheral edema, no murmurs appreciated Abdomen soft, nondistended, nontender Dressing in place on lower sacrum with pink-tinged drainage on dressing     I have personally reviewed the following:   Data Reviewed:  CBC Recent Labs  Lab 05/07/20 1715 05/08/20 0616  WBC 13.3* 13.9*  HGB 11.2* 11.3*  HCT 34.4* 34.2*  PLT 295 302  MCV 83.7 83.8  MCH 27.3 27.7  MCHC 32.6 33.0  RDW 13.3 13.2  LYMPHSABS 2.6 1.8  MONOABS 0.8 0.8  EOSABS 0.1 0.1  BASOSABS 0.1 0.0    Chemistries  Recent Labs  Lab 05/07/20 1715 05/07/20 1715 05/08/20 0616 05/09/20 0532 05/10/20 0549 05/10/20 0853 05/11/20 0658  NA 137  --  141  --   --  141  --   K 3.0*  --  3.4*  --   --  3.5  --   CL 96*  --  99  --   --  101  --   CO2 30  --  30  --   --  28  --   GLUCOSE 91  --  101*  --   --  96  --   BUN 12  --  10  --   --  10  --   CREATININE 0.60   < > 0.68 0.70 0.77 0.80 0.81  CALCIUM 8.8*  --  8.9  --   --  8.8*  --   MG  --   --  1.9  --   --   --   --   AST 15  --  16  --    --   --   --   ALT 12  --  12  --   --   --   --   ALKPHOS 90  --  89  --   --   --   --   BILITOT 0.2*  --  0.6  --   --   --   --    < > = values in this interval not displayed.   ------------------------------------------------------------------------------------------------------------------ No results for input(s): CHOL, HDL, LDLCALC, TRIG, CHOLHDL, LDLDIRECT in the last 72 hours.  No results found for: HGBA1C ------------------------------------------------------------------------------------------------------------------ Recent Labs    05/08/20 1558  TSH 4.754*   ------------------------------------------------------------------------------------------------------------------ Recent Labs    05/08/20 1558  VITAMINB12 954*  FOLATE 11.4    Coagulation profile No results for input(s): INR, PROTIME in the last 168 hours.  No results for input(s): DDIMER in the last 72 hours.  Cardiac Enzymes No results for input(s): CKMB, TROPONINI, MYOGLOBIN in the last 168 hours.  Invalid input(s): CK ------------------------------------------------------------------------------------------------------------------ No results found for: BNP  Micro Results Recent Results (from the past 240 hour(s))  Wound or Superficial Culture     Status: None   Collection Time: 05/07/20  3:22 PM   Specimen: Sacral; Wound  Result Value Ref Range Status   Specimen Description   Final    SACRAL Performed at [facility], [address] [place], [place] [address]    Special Requests   Final    Normal Performed at [facility], [address] [place], [place] [address]    Gram Stain   Final  FEW WBC PRESENT, PREDOMINANTLY PMN MODERATE GRAM NEGATIVE RODS FEW GRAM POSITIVE COCCI IN PAIRS IN CLUSTERS RARE GRAM POSITIVE RODS Performed at [facility], [address] [place], [place] [address]    Culture   Final    FEW STAPHYLOCOCCUS AUREUS FEW PROTEUS  MIRABILIS MODERATE ESCHERICHIA COLI    Report Status 05/11/2020 FINAL  Final   Organism ID, Bacteria STAPHYLOCOCCUS AUREUS  Final   Organism ID, Bacteria ESCHERICHIA COLI  Final   Organism ID, Bacteria PROTEUS MIRABILIS  Final      Susceptibility   Escherichia coli - MIC*    AMPICILLIN >=32 RESISTANT Resistant     CEFAZOLIN <=4 SENSITIVE Sensitive     CEFEPIME <=0.12 SENSITIVE Sensitive     CEFTAZIDIME <=1 SENSITIVE Sensitive     CEFTRIAXONE <=0.25 SENSITIVE Sensitive     CIPROFLOXACIN <=0.25 SENSITIVE Sensitive     GENTAMICIN <=1 SENSITIVE Sensitive     IMIPENEM <=0.25 SENSITIVE Sensitive     TRIMETH/SULFA <=20 SENSITIVE Sensitive     AMPICILLIN/SULBACTAM >=32 RESISTANT Resistant     PIP/TAZO <=4 SENSITIVE Sensitive     * MODERATE ESCHERICHIA COLI   Proteus mirabilis - MIC*    AMPICILLIN >=32 RESISTANT Resistant     CEFAZOLIN 8 SENSITIVE Sensitive     CEFEPIME <=0.12 SENSITIVE Sensitive     CEFTAZIDIME <=1 SENSITIVE Sensitive     CEFTRIAXONE <=0.25 SENSITIVE Sensitive     CIPROFLOXACIN >=4 RESISTANT Resistant     GENTAMICIN <=1 SENSITIVE Sensitive     IMIPENEM 8 INTERMEDIATE Intermediate     TRIMETH/SULFA >=320 RESISTANT Resistant     AMPICILLIN/SULBACTAM <=2 SENSITIVE Sensitive     PIP/TAZO <=4 SENSITIVE Sensitive     * FEW PROTEUS MIRABILIS   Staphylococcus aureus - MIC*    CIPROFLOXACIN >=8 RESISTANT Resistant     ERYTHROMYCIN >=8 RESISTANT Resistant     GENTAMICIN <=0.5 SENSITIVE Sensitive     OXACILLIN >=4 RESISTANT Resistant     TETRACYCLINE <=1 SENSITIVE Sensitive     VANCOMYCIN <=0.5 SENSITIVE Sensitive     TRIMETH/SULFA <=10 SENSITIVE Sensitive     CLINDAMYCIN >=8 RESISTANT Resistant     RIFAMPIN <=0.5 SENSITIVE Sensitive     Inducible Clindamycin NEGATIVE Sensitive     * FEW STAPHYLOCOCCUS AUREUS  Urine culture     Status: Abnormal   Collection Time: 05/07/20  4:07 PM   Specimen: Urine, Random  Result Value Ref Range Status   Specimen Description   Final     URINE, RANDOM Performed at [facility], [address] [place], [place] [address]    Special Requests   Final    Normal Performed at [facility], [address] [place], [place] [address]    Culture >=100,000 COLONIES/mL ESCHERICHIA COLI (A)  Final   Report Status 05/10/2020 FINAL  Final   Organism ID, Bacteria ESCHERICHIA COLI (A)  Final      Susceptibility   Escherichia coli - MIC*    AMPICILLIN >=32 RESISTANT Resistant     CEFAZOLIN <=4 SENSITIVE Sensitive     CEFEPIME <=0.12 SENSITIVE Sensitive     CEFTRIAXONE <=0.25 SENSITIVE Sensitive     CIPROFLOXACIN <=0.25 SENSITIVE Sensitive     GENTAMICIN <=1 SENSITIVE Sensitive     IMIPENEM <=0.25 SENSITIVE Sensitive     NITROFURANTOIN <=16 SENSITIVE Sensitive     TRIMETH/SULFA <=20 SENSITIVE Sensitive     AMPICILLIN/SULBACTAM >=32 RESISTANT Resistant     PIP/TAZO <=4 SENSITIVE Sensitive     * >=  100,000 COLONIES/mL ESCHERICHIA COLI  Resp Panel by RT-PCR (Flu A&B, Covid) Nasopharyngeal Swab     Status: None   Collection Time: 05/07/20  5:07 PM   Specimen: Nasopharyngeal Swab; Nasopharyngeal(NP) swabs in vial transport medium  Result Value Ref Range Status   SARS Coronavirus 2 by RT PCR NEGATIVE NEGATIVE Final    Comment: (NOTE) SARS-CoV-2 target nucleic acids are NOT DETECTED.  The SARS-CoV-2 RNA is generally detectable in upper respiratory specimens during the acute phase of infection. The lowest concentration of SARS-CoV-2 viral copies this assay can detect is 138 copies/mL. A negative result does not preclude SARS-Cov-2 infection and should not be used as the sole basis for treatment or other patient management decisions. A negative result may occur with  improper specimen collection/handling, submission of specimen other than nasopharyngeal swab, presence of viral mutation(s) within the areas targeted by this assay, and inadequate number of viral copies(<138 copies/mL). A negative result must be  combined with clinical observations, patient history, and epidemiological information. The expected result is Negative.  Fact Sheet for Patients:  BloggerCourse.com  Fact Sheet for Healthcare Providers:  SeriousBroker.it  This test is no t yet approved or cleared by the [place] FDA and  has been authorized for detection and/or diagnosis of SARS-CoV-2 by FDA under an Emergency Use Authorization (EUA). This EUA will remain  in effect (meaning this test can be used) for the duration of the COVID-19 declaration under Section 564(b)(1) of the Act, 21 U.S.C.section 360bbb-3(b)(1), unless the authorization is terminated  or revoked sooner.       Influenza A by PCR NEGATIVE NEGATIVE Final   Influenza B by PCR NEGATIVE NEGATIVE Final    Comment: (NOTE) The Xpert Xpress SARS-CoV-2/FLU/RSV plus assay is intended as an aid in the diagnosis of influenza from Nasopharyngeal swab specimens and should not be used as a sole basis for treatment. Nasal washings and aspirates are unacceptable for Xpert Xpress SARS-CoV-2/FLU/RSV testing.  Fact Sheet for Patients: BloggerCourse.com  Fact Sheet for Healthcare Providers: SeriousBroker.it  This test is not yet approved or cleared by the [place] FDA and has been authorized for detection and/or diagnosis of SARS-CoV-2 by FDA under an Emergency Use Authorization (EUA). This EUA will remain in effect (meaning this test can be used) for the duration of the COVID-19 declaration under Section 564(b)(1) of the Act, 21 U.S.C. section 360bbb-3(b)(1), unless the authorization is terminated or revoked.  Performed at [facility], [address] [place], [place] [address]   Culture, blood (single)     Status: None (Preliminary result)   Collection Time: 05/07/20  5:15 PM   Specimen: BLOOD LEFT WRIST  Result Value Ref Range Status    Specimen Description   Final    BLOOD LEFT WRIST Performed at [facility], [address] [place], [place] [address]    Special Requests   Final    BOTTLES DRAWN AEROBIC AND ANAEROBIC Blood Culture adequate volume Performed at [facility], [address] [place], [place] [address]    Culture   Final    NO GROWTH 4 DAYS Performed at [facility], [address], [place], [place] [address]    Report Status PENDING  Incomplete    Radiology Reports DG Chest 1 View  Result Date: 05/07/2020 CLINICAL DATA:  Sacral wound.  No fever or cough. EXAM: CHEST  1 VIEW COMPARISON:  11/22/2019 FINDINGS: Cardiac silhouette is normal in size. No mediastinal  or hilar masses or evidence of adenopathy. Clear lungs.  No pleural effusion or pneumothorax. Skeletal structures are demineralized but grossly intact. IMPRESSION: No active disease. Electronically Signed   By: Amie Portlandavid  Ormond M.D.   On: 05/07/2020 18:14   DG Pelvis 1-2 Views  Result Date: 05/07/2020 CLINICAL DATA:  Sacral wound.  Possible osteomyelitis. EXAM: PELVIS - 1-2 VIEW COMPARISON:  None. FINDINGS: Sacrum partly obscured by overlying bowel gas and stool. No fracture. No bone lesion. No visible bone resorption to suggest osteomyelitis. Hip joints, SI joints and symphysis pubis are normally spaced and aligned. Soft tissues are unremarkable. IMPRESSION: 1. No fracture, bone lesion or evidence of osteomyelitis. Mid to lower sacrum not well visualized. Electronically Signed   By: Amie Portlandavid  Ormond M.D.   On: 05/07/2020 18:15   CT HEAD WO CONTRAST  Result Date: 05/08/2020 CLINICAL DATA:  Mental status change, unknown cause. Additional provided: Patient admitted with wound infection, dementia. EXAM: CT HEAD WITHOUT CONTRAST TECHNIQUE: Contiguous axial images were obtained from the base of the skull through the vertex without intravenous contrast. COMPARISON:  Prior noncontrast head CT examination 11/22/2019 and earlier. FINDINGS:  Brain: Mild-to-moderate cerebral atrophy with a medial temporal lobe predominance. Advanced ill-defined hypoattenuation within the cerebral white matter is nonspecific, but compatible chronic small vessel ischemic disease. There is no acute intracranial hemorrhage. No demarcated cortical infarct. No extra-axial fluid collection. No evidence of intracranial mass. No midline shift. Vascular: No hyperdense vessel.  Atherosclerotic calcifications. Skull: Normal. Negative for fracture or focal lesion. Sinuses/Orbits: Visualized orbits show no acute finding. Minimal ethmoid sinus mucosal thickening. Other: Trace bilateral mastoid effusions. IMPRESSION: No evidence of acute intracranial abnormality. Mild/moderate cerebral atrophy with a medial temporal lobe predominance. Advanced chronic small vessel ischemic disease. Small bilateral mastoid effusions. Electronically Signed   By: Jackey LogeKyle  Golden DO   On: 05/08/2020 16:47     Time Spent in minutes  30     Laverna PeaceShayla D Anquanette Bahner M.D on 05/11/2020 at 2:52 PM  To page go to [email or web address] - password [email or web address]

## 2020-05-11 NOTE — Progress Notes (Signed)
  Speech Language Pathology Treatment: Dysphagia  Patient Details Name: Melinda Love MRN: [id number] DOB: 07/14/1933 Today's Date: 05/11/2020 Time: 6659-9357 SLP Time Calculation (min) (ACUTE ONLY): 33 min  Assessment / Plan / Recommendation Clinical Impression  Granddaughter Madelin Rear arrived to visit pt approximately half way through session and SLP educated her to aspiration mitigation strategies and means to maximize nutrition.  Pt willing to consume lunch with this therapist.  After pt was placed in reverse trendelenburg position, provided her with po.  Hand over hand assist with straw boluses of thin, mashed potatoes x5 bites, pork x1 bite, green beans x1 bite and total of approximately 1/2 of a container of magic cup.  Pt consumes liquids at rapid rate and thus SLP had to seal off the straw to prevent pt rom aspirating.  Overt cough with large sequential boluses of thin via straw but no evidence of aspiration or difficulties with single straw boluses. Pureed diet continues to be efficient for pt's swallowing and recommend to continue with full assist.  Cough with large bolus of thin tea - but with total cues and SlP pinching off straw - pt tolerating without s/s of aspiration.  SLP questions if pt would benefit from a palliative consult given her progressive neuro diagnosis, decrease in function over the last few months per notes and her reliance on others for feeding which increases her aspiration risk.  Will follow up for readiness for dietary advancement.  Pt is making significant progress with mentation and thus swallowing also.     HPI HPI: Melinda Love is a 86 y.o. female with medical history significant for Alzheimers dementia and is unable to give history. Family reports dementia has greatly worsened over past two months and that she has become somnolent most of the time with intermittent verbalizations/alertness. Pt has been maintained on a puree/thin diet since her bedside swallow  evaluation.      SLP Plan  Continue with current plan of care       Recommendations  Diet recommendations: Dysphagia 1 (puree);Thin liquid Liquids provided via: Straw Medication Administration: Crushed with puree Compensations: Small sips/bites;Slow rate Postural Changes and/or Swallow Maneuvers: Seated upright 90 degrees (appropriate for side lying elevation)                Oral Care Recommendations: Oral care BID;Staff/trained caregiver to provide oral care Follow up Recommendations: Skilled Nursing facility SLP Visit Diagnosis: Dysphagia, oral phase (R13.11) Plan: Continue with current plan of care       GO                Chales Abrahams 05/11/2020, 1:33 PM   Rolena Infante, MS [facility] SLP Acute Rehab Services Office [phone] Pager [phone]

## 2020-05-12 DIAGNOSIS — D72829 Elevated white blood cell count, unspecified: Secondary | ICD-10-CM

## 2020-05-12 LAB — CBC
HCT: 36.2 % (ref 36.0–46.0)
Hemoglobin: 11.4 g/dL — ABNORMAL LOW (ref 12.0–15.0)
MCH: 27.3 pg (ref 26.0–34.0)
MCHC: 31.5 g/dL (ref 30.0–36.0)
MCV: 86.6 fL (ref 80.0–100.0)
Platelets: 315 10*3/uL (ref 150–400)
RBC: 4.18 MIL/uL (ref 3.87–5.11)
RDW: 13.8 % (ref 11.5–15.5)
WBC: 12.2 10*3/uL — ABNORMAL HIGH (ref 4.0–10.5)
nRBC: 0 % (ref 0.0–0.2)

## 2020-05-12 LAB — CULTURE, BLOOD (SINGLE)
Culture: NO GROWTH
Special Requests: ADEQUATE

## 2020-05-12 NOTE — Progress Notes (Signed)
Physical Therapy Treatment Patient Details Name: Melinda Love MRN: [id number] DOB: 04/27/1934 Today's Date: 05/12/2020    History of Present Illness : Melinda Love is a 86 y.o. female with medical history significant for Alzheimers dementia and is unable to give history. History obtained from her daughter, Rock Nephew. She has been at [facility] unit since August and not ambulatory since this time. The facility noticed a wound on her sacrum on 11/19 and [facility]    PT Comments    Pt in bed incont urine/BM.  General Comments: pleasantly confused.  Expressed a few words "I'm cold" and repeated "Good mornining".  Assisted OOB.  Sit to sidelying: Total assist;+2 for physical assistance;+2 for safety/equipment.  General transfer comment: pt required + 2 side by side assist from bed to [facility] with increased asssit need to complete turn and target BSC.  Pt present with B hip and knee flexion and offered no assist with B UE's due to decreased cognition. General Gait Details: transfers only this session as pt was unable to take any fuctional steps during transfer  Assisted with hygiene then back to bed for HYDRO THERAPY.    Follow Up Recommendations  SNF     Equipment Recommendations  None recommended by PT    Recommendations for Other Services       Precautions / Restrictions Precautions Precautions: Fall Precaution Comments: right glut/sacral wound  Stage IV, Hx advanced Dementia Restrictions Weight Bearing Restrictions: No    Mobility  Bed Mobility Overal bed mobility: Needs Assistance Bed Mobility: Supine to Sit;Sit to Sidelying     Supine to sit: Max assist;HOB elevated   Sit to sidelying: Total assist;+2 for physical assistance;+2 for safety/equipment General bed mobility comments: pt  with increased alertness and initiated mvt with VC's and repeated Time to get up" after Therapist cue.  Once EOB pt was able to static sit at Supervision level x  3 min and maintain upright seated posture. Slightly fearful pt stated "I'm falling" then assisted her to [facility]  Transfers Overall transfer level: Needs assistance Equipment used: 2 person hand held assist Transfers: Stand Pivot Transfers;Sit to/from Stand Sit to Stand: Max assist Stand pivot transfers: Total assist;Max assist       General transfer comment: pt required + 2 side by side assist from bed to [facility] with increased asssit need to complete turn and target BSC.  Pt present with B hip and knee flexion and offered no assist with B UE's due to decreased cognition.  Ambulation/Gait             General Gait Details: transfers only this session as pt was unable to take any fuctional steps during transfer   Stairs             Wheelchair Mobility    Modified Rankin (Stroke Patients Only)       Balance                                            Cognition Arousal/Alertness: Awake/alert Behavior During Therapy: Flat affect Overall Cognitive Status: History of cognitive impairments - at baseline                                 General Comments: pleasantly confused.  Expressed a few  words "I'm cold" and repeated "Good mornining"      Exercises      General Comments        Pertinent Vitals/Pain Pain Assessment: No/denies pain Faces Pain Scale: Hurts a little bit Pain Location: during bed mobility and Htdro pt c/o pain R buttock Pain Descriptors / Indicators: Grimacing Pain Intervention(s): Monitored during session;Repositioned    Home Living                      Prior Function            PT Goals (current goals can now be found in the care plan section) Acute Rehab PT Goals Patient Stated Goal: Unable  Progress towards PT goals: Progressing toward goals    Frequency    Min 2X/week      PT Plan Current plan remains appropriate    Co-evaluation              AM-PAC PT "6 Clicks" Mobility    Outcome Measure  Help needed turning from your back to your side while in a flat bed without using bedrails?: Total Help needed moving from lying on your back to sitting on the side of a flat bed without using bedrails?: Total Help needed moving to and from a bed to a chair (including a wheelchair)?: Total Help needed standing up from a chair using your arms (e.g., wheelchair or bedside chair)?: Total Help needed to walk in hospital room?: Total Help needed climbing 3-5 steps with a railing? : Total 6 Click Score: 6    End of Session Equipment Utilized During Treatment: Gait belt Activity Tolerance: Patient tolerated treatment well Patient left: in bed;with call bell/phone within reach;with bed alarm set Nurse Communication: Mobility status PT Visit Diagnosis: Unsteadiness on feet (R26.81)     Time: 3300-7622 PT Time Calculation (min) (ACUTE ONLY): 25 min  Charges:  $Therapeutic Activity: 23-37 mins                     Felecia Shelling  PTA Acute  Rehabilitation Services Pager      [phone] Office      [phone]

## 2020-05-12 NOTE — Progress Notes (Signed)
PHYSICAL THERAPY  LPT will observe Hydro session tomorrow to re eval for frequency.  Wound healing and no longer requiring blunt debridement.  Greatest difficulty is keep wound free from urine and bowels.    05/12/20 1256 05/12/20 1300  Subjective Assessment  Subjective pt more verbal but mostly repeating  --   Evaluation and Treatment  Evaluation and Treatment Procedures Explained to Patient/Family Yes  --   Evaluation and Treatment Procedures agreed to  --   Pressure Injury 05/09/20 Sacrum Right;Mid Stage 4 - Full thickness tissue loss with exposed bone, tendon or muscle. PT ONLY- wound is on the right guteal area, above gluteal cleft.   Date First Assessed/Time First Assessed: 05/09/20 1100   Location: Sacrum  Location Orientation: Right;Mid  Staging: Stage 4 - Full thickness tissue loss with exposed bone, tendon or muscle.  Wound Description (Comments): PT ONLY- wound is on the righ...  Dressing Type Gauze (Comment);Other (Comment) (Mepilex)  --   Dressing Changed  --   Dressing Change Frequency Daily  --   % Wound base Red or Granulating 50%  --   % Wound base Yellow/Fibrinous Exudate 100%  --   Peri-wound Assessment Intact  --   Hydrotherapy  Pulsed lavage therapy - wound location R sacrum right sacrum  Pulsed Lavage with Suction (psi)  --  4 psi  Pulsed Lavage with Suction - Normal Saline Used  --  500 mL  Pulsed Lavage Tip  --  Tip with splash shield  Selective Debridement  Selective Debridement - Location  --  right sacral/gluteal area  Selective Debridement - Tools Used  --  Forceps;Scissors  Selective Debridement - Tissue Removed  --  slough  Wound Therapy - Assess/Plan/Recommendations  Wound Therapy - Clinical Statement  --  The  patient arouses and does speak some words. Patient does indicate discomfort during PLS. Patient's daughter present and  provided  history: Patient went to ALF in August , has not ambulated since, sits in [facility] or more recently bed bound. The open wound  is malaodorous with tan  drainage, brown and tan slough. a chunk of slough was debrided, no bleeding.  Patient will benefit from PT for PLS to decrease necrotic tissue, improve wound qualities and decrease  bioburden effects. Explained treatment to patient and daughter.   Wound Therapy - Functional Problem List  --  non ambulatory  Factors Delaying/Impairing Wound Healing  --  Incontinence;Immobility  Hydrotherapy Plan  --  Debridement;Dressing change;Pulsatile lavage with suction;Patient/family education  Wound Therapy - Frequency  --  6X / week  Wound Therapy - Current Recommendations  --  PT;Case manager/social work  Wound Therapy - Follow Up Recommendations  --  Skilled nursing facility  Wound Plan  --  PLS 6 x week with drebridement and dressing change  Wound Therapy Goals - Improve the function of patient's integumentary system by progressing the wound(s) through the phases of wound healing by:  Decrease Necrotic Tissue to  --  25  Increase Granulation Tissue to  --  75  Improve Drainage Characteristics  --  Min  Goals/treatment plan/discharge plan were made with and agreed upon by patient/family  --  Yes  Time For Goal Achievement  --  2 weeks  Wound Therapy - Potential for Goals  --  Fair    Felecia Shelling  PTA Acute  Rehabilitation Services Pager      [phone] Office      [phone]

## 2020-05-12 NOTE — Progress Notes (Signed)
Occupational Therapy Treatment Patient Details Name: Melinda Love MRN: [id number] DOB: 05/25/1934 Today's Date: 05/12/2020    History of present illness : Melinda Love is a 86 y.o. female with medical history significant for Alzheimers dementia and is unable to give history. History obtained from her daughter, Melinda Love. She has been at [facility] unit since August and not ambulatory since this time. The facility noticed a wound on her sacrum on 11/19 and [facility]   OT comments  Treatment focused on improving patient's ability to feed self and assist with grooming tasks in order to reduce caregiver burden. Patient able to attend to feeding task > than 5 minutes. Patient able to wash face and hands with setup and verbal cue to improve quality. Continue POC.   Follow Up Recommendations  SNF    Equipment Recommendations  None recommended by OT    Recommendations for Other Services      Precautions / Restrictions Precautions Precautions: Fall Precaution Comments: right glut/sacral wound  Stage IV, Hx advanced Dementia Restrictions Weight Bearing Restrictions: No       Mobility Bed Mobility                  Transfers                      Balance                                           ADL either performed or assessed with clinical judgement   ADL Overall ADL's : Needs assistance/impaired Eating/Feeding: Minimal assistance;Set up Eating/Feeding Details (indicate cue type and reason): Patient being fed by nurse tech when therapist entered the room. Therapist removed patient's safety mittens. Therapist took over task to allow patient to attempt to feed herself. Patient able to bring carton with straw to mouth to drink as well as normal coffee cup to drink without difficulty. Patient able to feed self with verbal cues for sequencing x 3 bites. Twice with fork and once with spoon to see if bowel utensil  improved scooping of food. Once patient grasped food with left hand and able to bring to mouth. Twice patient not able to manipulate utensil to get food onto fork and then therapist would assist. Patient reporting not wanting any more food but will ask for a drink. Grooming: Set up;Wash/dry face;Wash/dry Programmer, applications Details (indicate cue type and reason): Patient able to wash face and eyes with verbal cues to improve quality. Patient's eyes red rimmed and patient constantly rubbing eyes when safety mittens removed. patient also rubbing hands together and able to briefly wipe hands with set up.         Upper Body Dressing : Supervision/safety;Set up;Cueing for sequencing                           Vision   Vision Assessment?: No apparent visual deficits   Perception     Praxis      Cognition Arousal/Alertness: Awake/alert Behavior During Therapy: Flat affect Overall Cognitive Status: History of cognitive impairments - at baseline                                 General Comments: pleasantly confused.  Expressed a few words "I'm cold" and repeated "Good mornining"        Exercises     Shoulder Instructions       General Comments      Pertinent Vitals/ Pain       Pain Assessment: No/denies pain Faces Pain Scale: Hurts a little bit Pain Location: during bed mobility and Htdro pt c/o pain R buttock Pain Descriptors / Indicators: Grimacing Pain Intervention(s): Monitored during session;Repositioned  Home Living                                          Prior Functioning/Environment              Frequency  Min 2X/week        Progress Toward Goals  OT Goals(current goals can now be found in the care plan section)  Progress towards OT goals: Progressing toward goals  Acute Rehab OT Goals Patient Stated Goal: Unable  OT Goal Formulation: Patient unable to participate in goal setting Time For Goal Achievement: 05/24/20   Plan Discharge plan remains appropriate    Co-evaluation                 AM-PAC OT "6 Clicks" Daily Activity     Outcome Measure   Help from another person eating meals?: A Little Help from another person taking care of personal grooming?: A Little Help from another person toileting, which includes using toliet, bedpan, or urinal?: Total Help from another person bathing (including washing, rinsing, drying)?: Total Help from another person to put on and taking off regular upper body clothing?: A Lot Help from another person to put on and taking off regular lower body clothing?: Total 6 Click Score: 11    End of Session Equipment Utilized During Treatment: Gait belt  OT Visit Diagnosis: Unsteadiness on feet (R26.81);Muscle weakness (generalized) (M62.81);Pain   Activity Tolerance Patient tolerated treatment well   Patient Left in bed;with call bell/phone within reach;with bed alarm set   Nurse Communication  (able to predominantly feed self)        Time: 9924-2683 OT Time Calculation (min): 22 min  Charges: OT General Charges $OT Visit: 1 Visit OT Treatments $Self Care/Home Management : 8-22 mins  Waldron Session, OTR/L Acute Care Rehab Services  Office [phone] Pager: [phone]    Kelli Churn 05/12/2020, 12:44 PM

## 2020-05-12 NOTE — Progress Notes (Signed)
[facility]  PROGRESS NOTE  Melinda Love ZOX:[id number] DOB: November 17, 1933 DOA: 05/07/2020 PCP: Patient, No Pcp Per Admit date - 05/07/2020   Admitting Physician Orland Mustard, MD  Outpatient Primary MD for the patient is Patient, No Pcp Per  LOS - 5 Brief Narrative  [name] is a 86 year old female with medical history significant for Alzheimer's dementia, Sacral pressure injury being managed at her memory care unit at [facility], during evaluation at [facility] wound care center on 11/23 staged as stage IV with recommendations of wet-to-dry dressing and negative pressure wound therapy via wound VAC. Per daughter the wound recommendations were not being followed,  daughter brought her to ER due to concerns of it appearing worse.  During hospital course has remained afebrile hemodynamically stable, blood cultures unremarkable has slight white count of 13.3 on admission otherwise no signs or symptoms of sepsis UA was positive for nitrites and urine culture with E. coli (only resistant to ampicillin and Unasyn).  Superficial culture of sacral ulcer grew MRSA, E. coli, Proteus mirabilis.  Pelvic x-ray showed no evidence of osteomyelitis of mid to lower sacrum, chest x-ray unremarkable.  Patient was started on vancomycin and cefazolin before deescalation to cefdinir and doxycycline  Subjective  No complaints today. No acute events overnight  A & P  Sacral pressure ulcer stage IV, present on admission.  Over 75% nonviable tissue based on wound evaluation on 12/6Patient tolerating hydrotherapy by physical therapy (pulsatile lavage and suction) for continued debridement.  Pelvis x-ray on admission showed no evidence of osteomyelitis. But still having malodorous drainage -Air mattress -Continue hydrotherapy by PT daily -continue following wound healing/supplementation recommended by dietary -Continue Santyl for enzymatic debridement per wound recommendations  Cellulitis of sacral ulcer.   Osteomyelitis ruled out on admission.  With imaging and inability to probe to bone.  Had mild leukocytosis on admission otherwise has remained afebrile and no signs of sepsis.  Blood cultures are unremarkable.  Wound cultures grew MRSA, E. coli, Proteus -Currently tolerating cefdinir and p.o. doxycycline --low threshold to increase back to IV if febrile, or new leukocytosis  Acute metabolic encephalopathy, greatly improved. Likely being most driven from infection in setting of known dementia. Much improved today now alert to self and place. Following commands --continue antibiotics mentioned above --delirium precautions  E.Coli UTI. No current symptoms.  --continue cefdinir  Alzheimer's dementia with behavior disturbances, stable.  Alert to self and place this a.m. which is improved given concern for acute metabolic encephalopathy in the setting of infection due to confusion on admission -Continue Zoloft, Risperdal, Aricept, Namenda -Delirium precautions  B12 deficiency -Continue vitamin B12, multivitamin  Normocytic anemia. Iron panel seems consistent with anemia of chronic disease ( elevated ferritin, low iron). B12 and folate wnl. Hgb stable at 11. No current bleeding  Non ambulatory. Since August of this year has been wheelchair dependent . Required 2 person assist per PT evaluation.  -PT recommends SNF      Family Communication  : Spoke with daughter Melinda Love, [phone] [facility]) on 12/8  Code Status : DNR, discussed on day of admission  Disposition Plan  :  Patient is from memory care unit. Anticipated d/c date: 2 to 3 days. Barriers to d/c or necessity for inpatient status:  Needs daily PT hydrotherapy for continued debridement of pressure ulcer. PT recommends SNF  Consults  : Wound care  Procedures  : None  DVT Prophylaxis  :  Lovenox  MDM: The below labs and imaging reports were reviewed and summarized  above.  Medication management as above.  Lab Results   Component Value Date   PLT 315 05/12/2020    Diet :  Diet Order            DIET - DYS 1 Room service appropriate? Yes; Fluid consistency: Thin  Diet effective now                  Inpatient Medications Scheduled Meds: . (feeding supplement) PROSource Plus  30 mL Oral BID BM  . vitamin C  500 mg Oral Daily  . cefdinir  300 mg Oral Q12H  . cholecalciferol  5,000 Units Oral Daily  . collagenase   Topical Daily  . donepezil  10 mg Oral QHS  . doxycycline  100 mg Oral Q12H  . enoxaparin (LOVENOX) injection  40 mg Subcutaneous Daily  . feeding supplement  237 mL Oral BID BM  . melatonin  3 mg Oral QHS  . memantine  10 mg Oral Daily  . multivitamin with minerals  1 tablet Oral Daily  . polyvinyl alcohol  1 drop Both Eyes BID  . risperiDONE  0.75 mg Oral QHS  . sertraline  125 mg Oral Daily  . vitamin B-12  500 mcg Oral Daily  . zinc sulfate  220 mg Oral Daily   Continuous Infusions: . sodium chloride Stopped (05/10/20 2314)   PRN Meds:.sodium chloride, acetaminophen, LORazepam, polyvinyl alcohol  Antibiotics  :   Anti-infectives (From admission, onward)   Start     Dose/Rate Route Frequency Ordered Stop   05/10/20 2200  cefdinir (OMNICEF) capsule 300 mg        300 mg Oral Every 12 hours 05/10/20 1906     05/10/20 2200  doxycycline (VIBRA-TABS) tablet 100 mg        100 mg Oral Every 12 hours 05/10/20 1906     05/08/20 1200  vancomycin (VANCOREADY) IVPB 1250 mg/250 mL  Status:  Discontinued        1,250 mg 166.7 mL/hr over 90 Minutes Intravenous Every 24 hours 05/08/20 0143 05/10/20 1905   05/08/20 0200  piperacillin-tazobactam (ZOSYN) IVPB 3.375 g  Status:  Discontinued        3.375 g 12.5 mL/hr over 240 Minutes Intravenous Every 8 hours 05/08/20 0143 05/10/20 1905   05/07/20 1815  ceFAZolin (ANCEF) IVPB 1 g/50 mL premix        1 g 100 mL/hr over 30 Minutes Intravenous  Once 05/07/20 1812 05/07/20 1900   05/07/20 1815  vancomycin (VANCOCIN) IVPB 1000 mg/200 mL premix         1,000 mg 200 mL/hr over 60 Minutes Intravenous  Once 05/07/20 1814 05/07/20 2037       Objective   Vitals:   05/11/20 1325 05/11/20 2226 05/12/20 0512 05/12/20 1225  BP: (!) 118/54 (!) 131/46 (!) 124/53 126/60  Pulse: 66 69 68 73  Resp: 20 18 18 20   Temp: 98 F (36.7 C) 98 F (36.7 C) 98 F (36.7 C) 98.3 F (36.8 C)  TempSrc: Oral  Oral Oral  SpO2: 95% 92% 94% 94%  Weight:      Height:        SpO2: 94 %  Wt Readings from Last 3 Encounters:  05/07/20 66.8 kg  11/22/19 75.7 kg  10/07/17 63 kg     Intake/Output Summary (Last 24 hours) at 05/12/2020 1711 Last data filed at 05/12/2020 1030 Gross per 24 hour  Intake 407.9 ml  Output 1 ml  Net 406.9 ml  Physical Exam:  Awake, alert to self and place, in no acute distress Normal respiratory effort on room air Regular rate and rhythm, no peripheral edema, no murmurs appreciated Abdomen soft, nondistended, nontender Dressing in place on lower sacrum with pink-tinged drainage on dressing     I have personally reviewed the following:   Data Reviewed:  CBC Recent Labs  Lab 05/07/20 1715 05/08/20 0616 05/12/20 0505  WBC 13.3* 13.9* 12.2*  HGB 11.2* 11.3* 11.4*  HCT 34.4* 34.2* 36.2  PLT 295 302 315  MCV 83.7 83.8 86.6  MCH 27.3 27.7 27.3  MCHC 32.6 33.0 31.5  RDW 13.3 13.2 13.8  LYMPHSABS 2.6 1.8  --   MONOABS 0.8 0.8  --   EOSABS 0.1 0.1  --   BASOSABS 0.1 0.0  --     Chemistries  Recent Labs  Lab 05/07/20 1715 05/08/20 0616 05/09/20 0532 05/10/20 0549 05/10/20 0853 05/11/20 0658  NA 137 141  --   --  141  --   K 3.0* 3.4*  --   --  3.5  --   CL 96* 99  --   --  101  --   CO2 30 30  --   --  28  --   GLUCOSE 91 101*  --   --  96  --   BUN 12 10  --   --  10  --   CREATININE 0.60 0.68 0.70 0.77 0.80 0.81  CALCIUM 8.8* 8.9  --   --  8.8*  --   MG  --  1.9  --   --   --   --   AST 15 16  --   --   --   --   ALT 12 12  --   --   --   --   ALKPHOS 90 89  --   --   --   --   BILITOT  0.2* 0.6  --   --   --   --    ------------------------------------------------------------------------------------------------------------------ No results for input(s): CHOL, HDL, LDLCALC, TRIG, CHOLHDL, LDLDIRECT in the last 72 hours.  No results found for: HGBA1C ------------------------------------------------------------------------------------------------------------------ No results for input(s): TSH, T4TOTAL, T3FREE, THYROIDAB in the last 72 hours.  Invalid input(s): FREET3 ------------------------------------------------------------------------------------------------------------------ No results for input(s): VITAMINB12, FOLATE, FERRITIN, TIBC, IRON, RETICCTPCT in the last 72 hours.  Coagulation profile No results for input(s): INR, PROTIME in the last 168 hours.  No results for input(s): DDIMER in the last 72 hours.  Cardiac Enzymes No results for input(s): CKMB, TROPONINI, MYOGLOBIN in the last 168 hours.  Invalid input(s): CK ------------------------------------------------------------------------------------------------------------------ No results found for: BNP  Micro Results Recent Results (from the past 240 hour(s))  Wound or Superficial Culture     Status: None   Collection Time: 05/07/20  3:22 PM   Specimen: Sacral; Wound  Result Value Ref Range Status   Specimen Description   Final    SACRAL Performed at [facility], [address] [place], [place] [address]    Special Requests   Final    Normal Performed at [facility], [address] [place], [place] [address]    Gram Stain   Final    FEW WBC PRESENT, PREDOMINANTLY PMN MODERATE GRAM NEGATIVE RODS FEW GRAM POSITIVE COCCI IN PAIRS IN CLUSTERS RARE GRAM POSITIVE RODS Performed at [facility], [address] [place], [place] [address]    Culture   Final  FEW STAPHYLOCOCCUS AUREUS FEW PROTEUS MIRABILIS MODERATE ESCHERICHIA COLI    Report Status 05/11/2020 FINAL   Final   Organism ID, Bacteria STAPHYLOCOCCUS AUREUS  Final   Organism ID, Bacteria ESCHERICHIA COLI  Final   Organism ID, Bacteria PROTEUS MIRABILIS  Final      Susceptibility   Escherichia coli - MIC*    AMPICILLIN >=32 RESISTANT Resistant     CEFAZOLIN <=4 SENSITIVE Sensitive     CEFEPIME <=0.12 SENSITIVE Sensitive     CEFTAZIDIME <=1 SENSITIVE Sensitive     CEFTRIAXONE <=0.25 SENSITIVE Sensitive     CIPROFLOXACIN <=0.25 SENSITIVE Sensitive     GENTAMICIN <=1 SENSITIVE Sensitive     IMIPENEM <=0.25 SENSITIVE Sensitive     TRIMETH/SULFA <=20 SENSITIVE Sensitive     AMPICILLIN/SULBACTAM >=32 RESISTANT Resistant     PIP/TAZO <=4 SENSITIVE Sensitive     * MODERATE ESCHERICHIA COLI   Proteus mirabilis - MIC*    AMPICILLIN >=32 RESISTANT Resistant     CEFAZOLIN 8 SENSITIVE Sensitive     CEFEPIME <=0.12 SENSITIVE Sensitive     CEFTAZIDIME <=1 SENSITIVE Sensitive     CEFTRIAXONE <=0.25 SENSITIVE Sensitive     CIPROFLOXACIN >=4 RESISTANT Resistant     GENTAMICIN <=1 SENSITIVE Sensitive     IMIPENEM 8 INTERMEDIATE Intermediate     TRIMETH/SULFA >=320 RESISTANT Resistant     AMPICILLIN/SULBACTAM <=2 SENSITIVE Sensitive     PIP/TAZO <=4 SENSITIVE Sensitive     * FEW PROTEUS MIRABILIS   Staphylococcus aureus - MIC*    CIPROFLOXACIN >=8 RESISTANT Resistant     ERYTHROMYCIN >=8 RESISTANT Resistant     GENTAMICIN <=0.5 SENSITIVE Sensitive     OXACILLIN >=4 RESISTANT Resistant     TETRACYCLINE <=1 SENSITIVE Sensitive     VANCOMYCIN <=0.5 SENSITIVE Sensitive     TRIMETH/SULFA <=10 SENSITIVE Sensitive     CLINDAMYCIN >=8 RESISTANT Resistant     RIFAMPIN <=0.5 SENSITIVE Sensitive     Inducible Clindamycin NEGATIVE Sensitive     * FEW STAPHYLOCOCCUS AUREUS  Urine culture     Status: Abnormal   Collection Time: 05/07/20  4:07 PM   Specimen: Urine, Random  Result Value Ref Range Status   Specimen Description   Final    URINE, RANDOM Performed at [facility], [address] [place], [place] [address]    Special Requests   Final    Normal Performed at [facility], [address] [place], [place] [address]    Culture >=100,000 COLONIES/mL ESCHERICHIA COLI (A)  Final   Report Status 05/10/2020 FINAL  Final   Organism ID, Bacteria ESCHERICHIA COLI (A)  Final      Susceptibility   Escherichia coli - MIC*    AMPICILLIN >=32 RESISTANT Resistant     CEFAZOLIN <=4 SENSITIVE Sensitive     CEFEPIME <=0.12 SENSITIVE Sensitive     CEFTRIAXONE <=0.25 SENSITIVE Sensitive     CIPROFLOXACIN <=0.25 SENSITIVE Sensitive     GENTAMICIN <=1 SENSITIVE Sensitive     IMIPENEM <=0.25 SENSITIVE Sensitive     NITROFURANTOIN <=16 SENSITIVE Sensitive     TRIMETH/SULFA <=20 SENSITIVE Sensitive     AMPICILLIN/SULBACTAM >=32 RESISTANT Resistant     PIP/TAZO <=4 SENSITIVE Sensitive     * >=100,000 COLONIES/mL ESCHERICHIA COLI  Resp Panel by RT-PCR (Flu A&B, Covid) Nasopharyngeal Swab     Status: None   Collection Time: 05/07/20  5:07 PM   Specimen: Nasopharyngeal Swab; Nasopharyngeal(NP) swabs in vial transport medium  Result Value Ref Range Status   SARS Coronavirus 2 by RT PCR NEGATIVE NEGATIVE Final    Comment: (NOTE) SARS-CoV-2 target nucleic acids are NOT DETECTED.  The SARS-CoV-2 RNA is generally detectable in upper respiratory specimens during the acute phase of infection. The lowest concentration of SARS-CoV-2 viral copies this assay can detect is 138 copies/mL. A negative result does not preclude SARS-Cov-2 infection and should not be used as the sole basis for treatment or other patient management decisions. A negative result may occur with  improper specimen collection/handling, submission of specimen other than nasopharyngeal swab, presence of viral mutation(s) within the areas targeted by this assay, and inadequate number of viral copies(<138 copies/mL). A negative result must be combined with clinical observations, patient history, and  epidemiological information. The expected result is Negative.  Fact Sheet for Patients:  BloggerCourse.comhttps://www.fda.gov/media/152166/download  Fact Sheet for Healthcare Providers:  SeriousBroker.ithttps://www.fda.gov/media/152162/download  This test is no t yet approved or cleared by the [place] FDA and  has been authorized for detection and/or diagnosis of SARS-CoV-2 by FDA under an Emergency Use Authorization (EUA). This EUA will remain  in effect (meaning this test can be used) for the duration of the COVID-19 declaration under Section 564(b)(1) of the Act, 21 U.S.C.section 360bbb-3(b)(1), unless the authorization is terminated  or revoked sooner.       Influenza A by PCR NEGATIVE NEGATIVE Final   Influenza B by PCR NEGATIVE NEGATIVE Final    Comment: (NOTE) The Xpert Xpress SARS-CoV-2/FLU/RSV plus assay is intended as an aid in the diagnosis of influenza from Nasopharyngeal swab specimens and should not be used as a sole basis for treatment. Nasal washings and aspirates are unacceptable for Xpert Xpress SARS-CoV-2/FLU/RSV testing.  Fact Sheet for Patients: BloggerCourse.comhttps://www.fda.gov/media/152166/download  Fact Sheet for Healthcare Providers: SeriousBroker.ithttps://www.fda.gov/media/152162/download  This test is not yet approved or cleared by the [place] FDA and has been authorized for detection and/or diagnosis of SARS-CoV-2 by FDA under an Emergency Use Authorization (EUA). This EUA will remain in effect (meaning this test can be used) for the duration of the COVID-19 declaration under Section 564(b)(1) of the Act, 21 U.S.C. section 360bbb-3(b)(1), unless the authorization is terminated or revoked.  Performed at [facility], [address] [place], [place] [address]   Culture, blood (single)     Status: None   Collection Time: 05/07/20  5:15 PM   Specimen: BLOOD LEFT WRIST  Result Value Ref Range Status   Specimen Description   Final    BLOOD LEFT WRIST Performed at [facility], [address] [place], [place] [address]    Special Requests   Final    BOTTLES DRAWN AEROBIC AND ANAEROBIC Blood Culture adequate volume Performed at [facility], [address] [place], [place] [address]    Culture   Final    NO GROWTH 5 DAYS Performed at [facility], [address] [place], [place] [address]    Report Status 05/12/2020 FINAL  Final    Radiology Reports DG Chest 1 View  Result Date: 05/07/2020 CLINICAL DATA:  Sacral wound.  No fever or cough. EXAM: CHEST  1 VIEW COMPARISON:  11/22/2019 FINDINGS: Cardiac silhouette is normal in size. No mediastinal or hilar masses or evidence of adenopathy. Clear lungs.  No pleural effusion or pneumothorax. Skeletal structures are demineralized but grossly intact. IMPRESSION: No active disease. Electronically Signed   By: Amie Portlandavid  Ormond M.D.   On: 05/07/2020 18:14  DG Pelvis 1-2 Views  Result Date: 05/07/2020 CLINICAL DATA:  Sacral wound.  Possible osteomyelitis. EXAM: PELVIS - 1-2 VIEW COMPARISON:  None. FINDINGS: Sacrum partly obscured by overlying bowel gas and stool. No fracture. No bone lesion. No visible bone resorption to suggest osteomyelitis. Hip joints, SI joints and symphysis pubis are normally spaced and aligned. Soft tissues are unremarkable. IMPRESSION: 1. No fracture, bone lesion or evidence of osteomyelitis. Mid to lower sacrum not well visualized. Electronically Signed   By: Amie Portland M.D.   On: 05/07/2020 18:15   CT HEAD WO CONTRAST  Result Date: 05/08/2020 CLINICAL DATA:  Mental status change, unknown cause. Additional provided: Patient admitted with wound infection, dementia. EXAM: CT HEAD WITHOUT CONTRAST TECHNIQUE: Contiguous axial images were obtained from the base of the skull through the vertex without intravenous contrast. COMPARISON:  Prior noncontrast head CT examination 11/22/2019 and earlier. FINDINGS: Brain: Mild-to-moderate cerebral atrophy with a medial temporal lobe  predominance. Advanced ill-defined hypoattenuation within the cerebral white matter is nonspecific, but compatible chronic small vessel ischemic disease. There is no acute intracranial hemorrhage. No demarcated cortical infarct. No extra-axial fluid collection. No evidence of intracranial mass. No midline shift. Vascular: No hyperdense vessel.  Atherosclerotic calcifications. Skull: Normal. Negative for fracture or focal lesion. Sinuses/Orbits: Visualized orbits show no acute finding. Minimal ethmoid sinus mucosal thickening. Other: Trace bilateral mastoid effusions. IMPRESSION: No evidence of acute intracranial abnormality. Mild/moderate cerebral atrophy with a medial temporal lobe predominance. Advanced chronic small vessel ischemic disease. Small bilateral mastoid effusions. Electronically Signed   By: Jackey Loge DO   On: 05/08/2020 16:47     Time Spent in minutes  30     Laverna Peace M.D on 05/12/2020 at 5:11 PM  To page go to [email or web address] - password [facility]

## 2020-05-12 NOTE — TOC Progression Note (Signed)
Transition of Care [facility]) - Progression Note    Patient Details  Name: Melinda Love MRN: [id number] Date of Birth: May 26, 1934  Transition of Care [facility]) CM/SW Contact  Darleene Cleaver, [place] Phone Number: 05/12/2020, 4:51 PM  Clinical Narrative:     CSW spoke to patient's daughter to discuss new bed offers.  Patient only had one other offer.  CSW informed patient's daughter, that CSW is checking with [facility] to see if patient would be appropriate for LTACH due to her wound care.  CSW awaiting for call back from [facility].  Patient's daughter asked to have CSW check with [employer], CSW left message with [facility] admissions worker to call CSW back.   Expected Discharge Plan: Skilled Nursing Facility Barriers to Discharge: Continued Medical Work up  Expected Discharge Plan and Services Expected Discharge Plan: Skilled Nursing Facility In-house Referral: Clinical Social Work   Post Acute Care Choice: Skilled Nursing Facility Living arrangements for the past 2 months: Assisted Living Facility (Memory Care)                                       Social Determinants of Health (SDOH) Interventions    Readmission Risk Interventions No flowsheet data found.

## 2020-05-13 DIAGNOSIS — G9341 Metabolic encephalopathy: Secondary | ICD-10-CM

## 2020-05-13 DIAGNOSIS — Z7189 Other specified counseling: Secondary | ICD-10-CM

## 2020-05-13 DIAGNOSIS — Z515 Encounter for palliative care: Secondary | ICD-10-CM

## 2020-05-13 MED ORDER — BIOTENE DRY MOUTH MT LIQD: 15.0000 mL | OROMUCOSAL | Status: DC | PRN

## 2020-05-13 MED ORDER — COLLAGENASE 250 UNIT/GM EX OINT
TOPICAL_OINTMENT | Freq: Every day | CUTANEOUS | Status: DC
  Filled 2020-05-13: qty 30

## 2020-05-13 NOTE — TOC Progression Note (Signed)
Transition of Care [facility]) - Progression Note    Patient Details  Name: Melinda Love MRN: [id number] Date of Birth: 1933-06-19  Transition of Care [facility]) CM/SW Contact  Darleene [place]aver, [place] Phone Number: 05/13/2020, 5:01 PM  Clinical Narrative:     CSW spoke [facility], they can accept patient.  Patient's daughter spoke to [employer] rep and daughter is concerned about patient's wound care not getting being treated properly at [facility].  LT[facility] rep arranged for wound care nurse from [facility] to speak to patient's daughter tomorrow.  CSW also requested that the physician speak to daughter to discuss LTAC.  CSW to continue to follow patient's progress throughout discharge planning.  Expected Discharge Plan: Skilled Nursing Facility Barriers to Discharge: Continued Medical Work up  Expected Discharge Plan and Services Expected Discharge Plan: Skilled Nursing Facility In-house Referral: Clinical Social Work   Post Acute Care Choice: Skilled Nursing Facility Living arrangements for the past 2 months: Assisted Living Facility (Memory Care)                                       Social Determinants of Health (SDOH) Interventions    Readmission Risk Interventions No flowsheet data found.

## 2020-05-13 NOTE — Progress Notes (Signed)
   05/13/20   Hydrotherapy treatment note [id number]     Subjective oh that's sore.  Patient and Family Stated Goals per daughter on EVAL,, to heal wound, walk again.  Date of Onset  (present on adm.)  Prior Treatments seen in Wound clinic, dressing changes   Evaluation and Treatment  Evaluation and Treatment Procedures Explained to Patient/Family Yes  Evaluation and Treatment Procedures agreed to (daughter)  Pressure Injury 05/09/20 Sacrum Right;Mid Stage 4 - Full thickness tissue loss with exposed bone, tendon or muscle. PT ONLY- wound is on the right guteal area, above gluteal cleft.   Date First Assessed/Time First Assessed: 05/09/20 1100   Location: Sacrum  Location Orientation: Right;Mid  Staging: Stage 4 - Full thickness tissue loss with exposed bone, tendon or muscle.  Wound Description (Comments): PT ONLY- wound is on the righ...  Dressing Type Gauze (Comment);Moist to moist;Non adherent;Silicone dressing  Dressing Changed  Dressing Change Frequency Daily  Site / Wound Assessment Clean;Granulation tissue;Painful  % Wound base Red or Granulating 50%  % Wound base Yellow/Fibrinous Exudate 50%  Peri-wound Assessment Intact  Wound Length (cm) 3.5 cm  Wound Width (cm) 3 cm  Wound Depth (cm) 2.5 cm  Wound Surface Area (cm^2) 10.5 cm^2  Wound Volume (cm^3) 26.25 cm^3  Undermining (cm) 2.5  Margins Unattached edges (unapproximated)  Drainage Amount Minimal  Drainage Description Green  Treatment Debridement (Selective);Hydrotherapy (Pulse lavage);Packing (Saline gauze) (santyl)  Hydrotherapy  Pulsed lavage therapy - wound location right sacrum  Pulsed Lavage with Suction (psi) 4 psi  Pulsed Lavage with Suction - Normal Saline Used 1000 mL  Pulsed Lavage Tip Tip with splash shield  Selective Debridement  Selective Debridement - Location right sacral/gluteal area  Selective Debridement - Tools Used Forceps;Scissors  Selective Debridement - Tissue Removed slough  Wound Therapy  - Assess/Plan/Recommendations  Wound Therapy - Clinical Statement patient alert, clapping hands, indicates some discomfort with PLS. Allowed rest breaks from  PLS. Wound now has greenish appearance(? pseudomonas)> will follow for need for Dakin's. Wound base with yellow  fibrinous, darker slough at lower border. Patient will benefit from continued Hydrotherapy.  Wound Therapy - Functional Problem List non ambulatory  Factors Delaying/Impairing Wound Healing Incontinence;Immobility  Hydrotherapy Plan Debridement;Dressing change;Pulsatile lavage with suction;Patient/family education  Wound Therapy - Frequency 6X / week  Wound Therapy - Current Recommendations PT;Case manager/social work  Wound Therapy - Follow Up Recommendations Skilled nursing facility (LTACH)  Wound Plan PLS 6 x week with drebridement and dressing change  Wound Therapy Goals - Improve the function of patient's integumentary system by progressing the wound(s) through the phases of wound healing by:  Decrease Necrotic Tissue to 25  Decrease Necrotic Tissue - Progress Progressing toward goal  Increase Granulation Tissue to 75  Increase Granulation Tissue - Progress Progressing toward goal  Improve Drainage Characteristics Min  Improve Drainage Characteristics - Progress Progressing toward goal  Goals/treatment plan/discharge plan were made with and agreed upon by patient/family Yes  Time For Goal Achievement 2 weeks  Wound Therapy - Potential for Goals Fair  Blanchard Kelch PT Acute Rehabilitation Services Pager [phone] Office [phone]

## 2020-05-13 NOTE — Progress Notes (Signed)
  Speech Language Pathology Treatment: Dysphagia  Patient Details Name: Melinda Love MRN: [id number] DOB: Oct 10, 1933 Today's Date: 05/13/2020 Time: 3154-0086 SLP Time Calculation (min) (ACUTE ONLY): 15 min  Assessment / Plan / Recommendation Clinical Impression  Pt sleeping uopn SlP entrance to room.  She woke easily and was wililng to consume some breakfast.  She is able to provide herself liquids using straw and to bring spoon/fork to mouth once loaded by SLP.  No indication of aspiration but pt does continue with swallow initiation delay (clinically appears more oral than pharyngeal).  Use of liquids faciliated swallow without retention.  Pt prefers liquids consuming all of her cranberry juice and her milk but only took a few bites of foods including graham cracker.  Recommend to advance diet to dys3/thin and maximize liquid nutrition.  SLP will follow up x1 more next week given dietary advancement and suspected oral deficits to assure tolerating po intake.  Informed pt to recommendations but her dementia prevents her from fully comprehending.   HPI HPI: Melinda Love is a 85 y.o. female with medical history significant for Alzheimers dementia and is unable to give history. Family reports dementia has greatly worsened over past two months and that she has become somnolent most of the time with intermittent verbalizations/alertness. Pt has been maintained on a puree/thin diet since her bedside swallow evaluation.      SLP Plan  Continue with current plan of care       Recommendations  Diet recommendations: Dysphagia 3 (mechanical soft);Thin liquid Liquids provided via: Straw Medication Administration: Crushed with puree Compensations: Small sips/bites;Slow rate;Follow solids with liquid Postural Changes and/or Swallow Maneuvers: Seated upright 90 degrees (appropriate for side lying elevation)                Oral Care Recommendations: Oral care BID;Staff/trained caregiver to provide  oral care Follow up Recommendations: Skilled Nursing facility SLP Visit Diagnosis: Dysphagia, oral phase (R13.11) Plan: Continue with current plan of care       GO                Chales Abrahams 05/13/2020, 8:44 AM   Rolena Infante, MS [facility] SLP Acute Rehab Services Office [phone] Pager [phone]

## 2020-05-13 NOTE — Consult Note (Signed)
Consultation Note Date: 05/13/2020   Patient Name: Melinda Love  DOB: [birth year]  MRN: [id number]  Age / Sex: 86 y.o., female  PCP: Patient, No Pcp Per Referring Physician: Laverna Peace, MD  Reason for Consultation: Establishing goals of care  HPI/Patient Profile: 86 y.o. female  admitted on 05/07/2020  Narrative of her history of present illness as per [facility] MD:  Ms. Benthall is a 86 year old female with medical history significant for Alzheimer's dementia, Sacral pressure injury being managed at her memory care unit at [facility], during evaluation at [facility] wound care center on 11/23 staged as stage IV with recommendations of wet-to-dry dressing and negative pressure wound therapy via wound VAC. Per daughter the wound recommendations were not being followed,  daughter brought her to ER due to concerns of it appearing worse.  During hospital course has remained afebrile hemodynamically stable, blood cultures unremarkable has slight white count of 13.3 on admission otherwise no signs or symptoms of sepsis UA was positive for nitrites and urine culture with E. coli (only resistant to ampicillin and Unasyn).  Superficial culture of sacral ulcer grew MRSA, E. coli, Proteus mirabilis.  Pelvic x-ray showed no evidence of osteomyelitis of mid to lower sacrum, chest x-ray unremarkable.  Patient was started on vancomycin and cefazolin before deescalation to cefdinir and doxycycline.  Clinical Assessment and Goals of Care:  A palliative consult has been requested for goals of care discussions.   Chart reviewed, patient seen and examined. Patient interacts and verbalizes some, she is in no distress. Call placed and discussed with daughter Ms Lanae Boast.   Palliative medicine is specialized medical care for people living with serious illness. It focuses on providing relief from the symptoms and stress of a serious illness.  The goal is to improve quality of life for both the patient and the family.  Goals of care: Broad aims of medical therapy in relation to the patient's values and preferences. Our aim is to provide medical care aimed at enabling patients to achieve the goals that matter most to them, given the circumstances of their particular medical situation and their constraints.   We talked about SLP recommendations and current hospital course. Goals, wishes and values important to patient/daughter discussed in detail. Disposition options discussed. Daughter states that she remains concerned about the patient going to [facility] where she can get appropriate wound care/hydrotherapy and for her to be on the road to recovery. She does realize that the patient has serious irreversible illness of dementia and sacral pressure wound.   NEXT OF KIN  daughter Ms Lanae Boast, I spoke with her on the phone.   SUMMARY OF RECOMMENDATIONS   Agree with DNR Goals of care: Discussed with daughter on the phone: LTACH for wound care, encourage PO, OOB. Daughter hopes that with wound care and nutrition, the patient might make progress towards stabilization/recovery. She is appreciative of nursing, SLP and Minerva Areola from [facility]'s support in her mother's hospital care.   Code Status/Advance Care Planning:  DNR    Symptom Management:  As above.   Palliative Prophylaxis:   Delirium Protocol  Psycho-social/Spiritual:   Desire for further Chaplaincy support:yes  Additional Recommendations: Caregiving  Support/Resources  Prognosis:   Unable to determine  Discharge Planning: LTACH for wound care      Primary Diagnoses: Present on Admission: . Sacral decubitus ulcer, stage IV (HCC) . (Resolved) Hyperlipidemia . 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 E-coli UTI . Acute metabolic encephalopathy   I have reviewed the medical record, interviewed the patient and family, and examined the patient. The following  aspects are pertinent.  Past Medical History:  Diagnosis Date  . Alzheimer disease (HCC)   . Chronic back pain    Social History   Socioeconomic History  . Marital status: Married    Spouse name: Not on file  . Number of children: Not on file  . Years of education: Not on file  . Highest education level: Not on file  Occupational History  . Not on file  Tobacco Use  . Smoking status: Never Smoker  . Smokeless tobacco: Never Used  Substance and Sexual Activity  . Alcohol use: No  . Drug use: No  . Sexual activity: Not on file  Other Topics Concern  . Not on file  Social History Narrative  . Not on file   Social Determinants of Health   Financial Resource Strain: Not on file  Food Insecurity: Not on file  Transportation Needs: Not on file  Physical Activity: Not on file  Stress: Not on file  Social Connections: Not on file   History reviewed. No pertinent family history. Scheduled Meds: . (feeding supplement) PROSource Plus  30 mL Oral BID BM  . vitamin C  500 mg Oral Daily  . cefdinir  300 mg Oral Q12H  . cholecalciferol  5,000 Units Oral Daily  . collagenase   Topical Daily  . donepezil  10 mg Oral QHS  . doxycycline  100 mg Oral Q12H  . enoxaparin (LOVENOX) injection  40 mg Subcutaneous Daily  . feeding supplement  237 mL Oral BID BM  . melatonin  3 mg Oral QHS  . memantine  10 mg Oral Daily  . multivitamin with minerals  1 tablet Oral Daily  . polyvinyl alcohol  1 drop Both Eyes BID  . risperiDONE  0.75 mg Oral QHS  . sertraline  125 mg Oral Daily  . vitamin B-12  500 mcg Oral Daily  . zinc sulfate  220 mg Oral Daily   Continuous Infusions: . sodium chloride Stopped (05/10/20 2314)   PRN Meds:.sodium chloride, acetaminophen, antiseptic oral rinse, LORazepam, polyvinyl alcohol Medications Prior to Admission:  Prior to Admission medications   Medication Sig Start Date End Date Taking? Authorizing Provider  acetaminophen (TYLENOL) 325 MG tablet Take 650  mg by mouth every 6 (six) hours as needed for mild pain or fever.    Yes [provider]  aspirin EC 81 MG tablet Take 81 mg by mouth daily. Swallow whole.   Yes [provider]  busPIRone (BUSPAR) 10 MG tablet Take 10 mg by mouth 3 (three) times daily.   Yes [provider]  carbamide peroxide (DEBROX) 6.5 % OTIC solution Place 3 drops into both ears at bedtime.   Yes [provider]  Cholecalciferol 125 MCG (5000 UT) TABS Take 5,000 Units by mouth daily.    Yes [provider]  donepezil (ARICEPT) 10 MG tablet Take 10 mg by mouth at bedtime.   Yes [provider]  furosemide (LASIX) 20 MG tablet Take 20 mg by mouth daily.    Yes [provider]  hydroxypropyl methylcellulose / hypromellose (ISOPTO TEARS / GONIOVISC) 2.5 % ophthalmic solution Place 2 drops into both eyes.   Yes [provider]  LORazepam (ATIVAN) 0.5 MG tablet Take 0.5 mg by mouth 2 (two) times daily.   Yes [provider]  melatonin 1 MG TABS tablet Take 3 mg by mouth at bedtime.   Yes [provider]  memantine (NAMENDA) 10 MG tablet Take 10 mg by mouth daily.   Yes [provider]  risperiDONE (RISPERDAL) 0.5 MG tablet Take 0.75 mg by mouth 2 (two) times daily.   Yes [provider]  sertraline (ZOLOFT) 100 MG tablet Take 125 mg by mouth daily.   Yes [provider]  Skin Protectants, Misc. (EUCERIN) cream Apply 1 application topically every evening. Apply to both feet   Yes [provider]  traZODone (DESYREL) 50 MG tablet Take 50 mg by mouth at bedtime.   Yes [provider]  vitamin B-12 (CYANOCOBALAMIN) 500 MCG tablet Take 500 mcg by mouth daily.   Yes [provider]  zinc gluconate 50 MG tablet Take 50 mg by mouth daily.   Yes [provider]  guaifenesin (ROBITUSSIN) 100 MG/5ML syrup Take 5-10 mLs (100-200 mg total) by mouth every 4 (four) hours as needed for  cough. Patient not taking: Reported on 05/08/2020 10/07/17   Pricilla Loveless, MD   Allergies  Allergen Reactions  . Sulfa Antibiotics Swelling   Review of Systems DENIES PAIN Physical Exam Talks some Keeps on wringing her hands Poor dentition.  Has dressing on sacral area Regular work of breathing Abdomen not distended.   Vital Signs: BP 138/64 (BP Location: Left Arm)   Pulse 63   Temp 98.5 F (36.9 C) (Oral)   Resp 18   Ht 5\' 8"  (1.727 m)   Wt 66.8 kg   SpO2 95%   BMI 22.39 kg/m  Pain Scale: Faces   Pain Score: 0-No pain   SpO2: SpO2: 95 % O2 Device:SpO2: 95 % O2 Flow Rate: .   IO: Intake/output summary:   Intake/Output Summary (Last 24 hours) at 05/13/2020 1104 Last data filed at 05/13/2020 0855 Gross per 24 hour  Intake 720 ml  Output --  Net 720 ml    LBM: Last BM Date: 05/12/20 Baseline Weight: Weight: 69.2 kg Most recent weight: Weight: 66.8 kg     Palliative Assessment/Data:   PPS 40%  Time In: 9  Time Out: 10 Time Total: 60  Greater than 50%  of this time was spent counseling and coordinating care related to the above assessment and plan.  Signed by: 14/09/21, MD   Please contact Palliative Medicine Team phone at [phone] for questions and concerns.  For individual provider: See [phone]

## 2020-05-13 NOTE — Progress Notes (Signed)
[facility]  PROGRESS NOTE  Melinda RamsayBarbara Love XBJ:[id number] DOB: 09-13-1933 DOA: 05/07/2020 PCP: Patient, No Pcp Per Admit date - 05/07/2020   Admitting Physician Orland MustardAllison Wolfe, MD  Outpatient Primary MD for the patient is Patient, No Pcp Per  LOS - 6 Brief Narrative  Ms. Melinda Love is a 86 year old female with medical history significant for Alzheimer's dementia, Sacral pressure injury being managed [facility], during [facility] on 11/23 staged as stage IV with recommendations of wet-to-dry dressing and negative pressure wound therapy via wound VAC. Per daughter the wound recommendations were not being followed,  daughter brought her to ER due to concerns of it appearing worse.  During hospital course has remained afebrile hemodynamically stable, blood cultures unremarkable has slight white count of 13.3 on admission otherwise no signs or symptoms of sepsis UA was positive for nitrites and urine culture with E. coli (only resistant to ampicillin and Unasyn).  Superficial culture of sacral ulcer grew MRSA, E. coli, Proteus mirabilis.  Pelvic x-ray showed no evidence of osteomyelitis of mid to lower sacrum, chest x-ray unremarkable.  Patient was started on vancomycin and cefazolin before deescalation to cefdinir and doxycycline  Subjective  No complaints today. No acute events overnight  A & P  Sacral pressure ulcer stage IV, present on admission.  Over 75% nonviable tissue based on wound evaluation on 12/6Patient tolerating hydrotherapy by physical therapy (pulsatile lavage and suction) for continued debridement.  Pelvis x-ray on admission showed no evidence of osteomyelitis. But still having need for continued debridement via pulsatile lavage. Wound now greenish in color per PT. No changes in WBC or fever, --closely monitor to determine need to broaden out to pseudomonal coverage -Air mattress -Continue hydrotherapy by PT daily, will still  benefit from continued debridement -continue following wound healing/supplementation recommended by dietary -Continue Santyl for enzymatic debridement per wound recommendations  Cellulitis of sacral ulcer.  Osteomyelitis ruled out on admission.  With imaging and inability to probe to bone.  Had mild leukocytosis on admission otherwise has remained afebrile and no signs of sepsis.  Blood cultures are unremarkable.  Wound cultures grew MRSA, E. coli, Proteus -Currently tolerating cefdinir and p.o. doxycycline --low threshold to increase back to IV if febrile, or new leukocytosis, in particular pseudomonal coverage given more green in color per CT  Acute metabolic encephalopathy, greatly improved. Likely being most driven from infection in setting of known dementia. Much improved today now alert to self and place. Following commands --continue antibiotics mentioned above --delirium precautions  E.Coli UTI. No current symptoms.  --continue cefdinir  Alzheimer's dementia with behavior disturbances, stable.  Alert to self and place this a.m. which is improved given concern for acute metabolic encephalopathy in the setting of infection due to confusion on admission -Continue Zoloft, Risperdal, Aricept, Namenda -Delirium precautions  B12 deficiency -Continue vitamin B12, multivitamin  Normocytic anemia. Iron panel seems consistent with anemia of chronic disease ( elevated ferritin, low iron). B12 and folate wnl. Hgb stable at 11. No current bleeding  Non ambulatory. Since August of this year has been wheelchair dependent . Required 2 person assist per PT evaluation.  -PT recommends SNF, TOC arranging for LTAC to help with level of wound care, will discuss with daughter      Family Communication  : Spoke with daughter Melinda Love, [name], [phone] [facility] on 12/8  Code Status : DNR, discussed on day of admission  Disposition Plan  :  Patient is from memory  care unit. Anticipated d/c date:  2 to 3 days. Barriers to d/c or necessity for inpatient status:  Needs daily PT hydrotherapy for continued debridement of pressure ulcer. PT recommends SNF  Consults  : Wound care  Procedures  : None  DVT Prophylaxis  :  Lovenox  MDM: The below labs and imaging reports were reviewed and summarized above.  Medication management as above.  Lab Results  Component Value Date   PLT 315 05/12/2020    Diet :  Diet Order            DIET DYS 3 Room service appropriate? Yes; Fluid consistency: Thin  Diet effective now                  Inpatient Medications Scheduled Meds: . (feeding supplement) PROSource Plus  30 mL Oral BID BM  . vitamin C  500 mg Oral Daily  . cefdinir  300 mg Oral Q12H  . cholecalciferol  5,000 Units Oral Daily  . collagenase   Topical Daily  . donepezil  10 mg Oral QHS  . doxycycline  100 mg Oral Q12H  . enoxaparin (LOVENOX) injection  40 mg Subcutaneous Daily  . feeding supplement  237 mL Oral BID BM  . melatonin  3 mg Oral QHS  . memantine  10 mg Oral Daily  . multivitamin with minerals  1 tablet Oral Daily  . polyvinyl alcohol  1 drop Both Eyes BID  . risperiDONE  0.75 mg Oral QHS  . sertraline  125 mg Oral Daily  . vitamin B-12  500 mcg Oral Daily  . zinc sulfate  220 mg Oral Daily   Continuous Infusions: . sodium chloride Stopped (05/10/20 2314)   PRN Meds:.sodium chloride, acetaminophen, antiseptic oral rinse, LORazepam, polyvinyl alcohol  Antibiotics  :   Anti-infectives (From admission, onward)   Start     Dose/Rate Route Frequency Ordered Stop   05/10/20 2200  cefdinir (OMNICEF) capsule 300 mg        300 mg Oral Every 12 hours 05/10/20 1906     05/10/20 2200  doxycycline (VIBRA-TABS) tablet 100 mg        100 mg Oral Every 12 hours 05/10/20 1906     05/08/20 1200  vancomycin (VANCOREADY) IVPB 1250 mg/250 mL  Status:  Discontinued        1,250 mg 166.7 mL/hr over 90 Minutes Intravenous Every 24 hours 05/08/20 0143 05/10/20 1905   05/08/20  0200  piperacillin-tazobactam (ZOSYN) IVPB 3.375 g  Status:  Discontinued        3.375 g 12.5 mL/hr over 240 Minutes Intravenous Every 8 hours 05/08/20 0143 05/10/20 1905   05/07/20 1815  ceFAZolin (ANCEF) IVPB 1 g/50 mL premix        1 g 100 mL/hr over 30 Minutes Intravenous  Once 05/07/20 1812 05/07/20 1900   05/07/20 1815  vancomycin (VANCOCIN) IVPB 1000 mg/200 mL premix        1,000 mg 200 mL/hr over 60 Minutes Intravenous  Once 05/07/20 1814 05/07/20 2037       Objective   Vitals:   05/12/20 2100 05/13/20 0551 05/13/20 1406 05/13/20 2046  BP: (!) 154/67 138/64 131/64 (!) 145/63  Pulse: 79 63 75 78  Resp: 18 18 18 18   Temp: 98.8 F (37.1 C) 98.5 F (36.9 C) 98.8 F (37.1 C) 98.4 F (36.9 C)  TempSrc: Oral Oral Oral   SpO2: 92% 95% 95% 95%  Weight:      Height:  SpO2: 95 %  Wt Readings from Last 3 Encounters:  05/07/20 66.8 kg  11/22/19 75.7 kg  10/07/17 63 kg     Intake/Output Summary (Last 24 hours) at 05/13/2020 2120 Last data filed at 05/13/2020 1857 Gross per 24 hour  Intake 300 ml  Output 650 ml  Net -350 ml    Physical Exam:  Awake, alert to self only today, in no acute distress Normal respiratory effort on room air Regular rate and rhythm, no peripheral edema, no murmurs appreciated Abdomen soft, nondistended, nontender      I have personally reviewed the following:   Data Reviewed:  CBC Recent Labs  Lab 05/07/20 1715 05/08/20 0616 05/12/20 0505  WBC 13.3* 13.9* 12.2*  HGB 11.2* 11.3* 11.4*  HCT 34.4* 34.2* 36.2  PLT 295 302 315  MCV 83.7 83.8 86.6  MCH 27.3 27.7 27.3  MCHC 32.6 33.0 31.5  RDW 13.3 13.2 13.8  LYMPHSABS 2.6 1.8  --   MONOABS 0.8 0.8  --   EOSABS 0.1 0.1  --   BASOSABS 0.1 0.0  --     Chemistries  Recent Labs  Lab 05/07/20 1715 05/08/20 0616 05/09/20 0532 05/10/20 0549 05/10/20 0853 05/11/20 0658  NA 137 141  --   --  141  --   K 3.0* 3.4*  --   --  3.5  --   CL 96* 99  --   --  101  --    CO2 30 30  --   --  28  --   GLUCOSE 91 101*  --   --  96  --   BUN 12 10  --   --  10  --   CREATININE 0.60 0.68 0.70 0.77 0.80 0.81  CALCIUM 8.8* 8.9  --   --  8.8*  --   MG  --  1.9  --   --   --   --   AST 15 16  --   --   --   --   ALT 12 12  --   --   --   --   ALKPHOS 90 89  --   --   --   --   BILITOT 0.2* 0.6  --   --   --   --    ------------------------------------------------------------------------------------------------------------------ No results for input(s): CHOL, HDL, LDLCALC, TRIG, CHOLHDL, LDLDIRECT in the last 72 hours.  No results found for: HGBA1C ------------------------------------------------------------------------------------------------------------------ No results for input(s): TSH, T4TOTAL, T3FREE, THYROIDAB in the last 72 hours.  Invalid input(s): FREET3 ------------------------------------------------------------------------------------------------------------------ No results for input(s): VITAMINB12, FOLATE, FERRITIN, TIBC, IRON, RETICCTPCT in the last 72 hours.  Coagulation profile No results for input(s): INR, PROTIME in the last 168 hours.  No results for input(s): DDIMER in the last 72 hours.  Cardiac Enzymes No results for input(s): CKMB, TROPONINI, MYOGLOBIN in the last 168 hours.  Invalid input(s): CK ------------------------------------------------------------------------------------------------------------------ No results found for: BNP  Micro Results Recent Results (from the past 240 hour(s))  Wound or Superficial Culture     Status: None   Collection Time: 05/07/20  3:22 PM   Specimen: Sacral; Wound  Result Value Ref Range Status   Specimen Description   Final    SACRAL Performed at [facility], [address] [place], [place] [address]    Special Requests   Final    Normal Performed at [facility], [address] [place], [place] [address]    Gram  Stain   Final    FEW WBC PRESENT,  PREDOMINANTLY PMN MODERATE GRAM NEGATIVE RODS FEW GRAM POSITIVE COCCI IN PAIRS IN CLUSTERS RARE GRAM POSITIVE RODS Performed at [facility], [address] [place], [place] [address]    Culture   Final    FEW STAPHYLOCOCCUS AUREUS FEW PROTEUS MIRABILIS MODERATE ESCHERICHIA COLI    Report Status 05/11/2020 FINAL  Final   Organism ID, Bacteria STAPHYLOCOCCUS AUREUS  Final   Organism ID, Bacteria ESCHERICHIA COLI  Final   Organism ID, Bacteria PROTEUS MIRABILIS  Final      Susceptibility   Escherichia coli - MIC*    AMPICILLIN >=32 RESISTANT Resistant     CEFAZOLIN <=4 SENSITIVE Sensitive     CEFEPIME <=0.12 SENSITIVE Sensitive     CEFTAZIDIME <=1 SENSITIVE Sensitive     CEFTRIAXONE <=0.25 SENSITIVE Sensitive     CIPROFLOXACIN <=0.25 SENSITIVE Sensitive     GENTAMICIN <=1 SENSITIVE Sensitive     IMIPENEM <=0.25 SENSITIVE Sensitive     TRIMETH/SULFA <=20 SENSITIVE Sensitive     AMPICILLIN/SULBACTAM >=32 RESISTANT Resistant     PIP/TAZO <=4 SENSITIVE Sensitive     * MODERATE ESCHERICHIA COLI   Proteus mirabilis - MIC*    AMPICILLIN >=32 RESISTANT Resistant     CEFAZOLIN 8 SENSITIVE Sensitive     CEFEPIME <=0.12 SENSITIVE Sensitive     CEFTAZIDIME <=1 SENSITIVE Sensitive     CEFTRIAXONE <=0.25 SENSITIVE Sensitive     CIPROFLOXACIN >=4 RESISTANT Resistant     GENTAMICIN <=1 SENSITIVE Sensitive     IMIPENEM 8 INTERMEDIATE Intermediate     TRIMETH/SULFA >=320 RESISTANT Resistant     AMPICILLIN/SULBACTAM <=2 SENSITIVE Sensitive     PIP/TAZO <=4 SENSITIVE Sensitive     * FEW PROTEUS MIRABILIS   Staphylococcus aureus - MIC*    CIPROFLOXACIN >=8 RESISTANT Resistant     ERYTHROMYCIN >=8 RESISTANT Resistant     GENTAMICIN <=0.5 SENSITIVE Sensitive     OXACILLIN >=4 RESISTANT Resistant     TETRACYCLINE <=1 SENSITIVE Sensitive     VANCOMYCIN <=0.5 SENSITIVE Sensitive     TRIMETH/SULFA <=10 SENSITIVE Sensitive     CLINDAMYCIN >=8 RESISTANT Resistant     RIFAMPIN <=0.5  SENSITIVE Sensitive     Inducible Clindamycin NEGATIVE Sensitive     * FEW STAPHYLOCOCCUS AUREUS  Urine culture     Status: Abnormal   Collection Time: 05/07/20  4:07 PM   Specimen: Urine, Random  Result Value Ref Range Status   Specimen Description   Final    URINE, RANDOM Performed at [facility], [address] [place], [place] [address]    Special Requests   Final    Normal Performed at [facility], [address] [place], [place] [address]    Culture >=100,000 COLONIES/mL ESCHERICHIA COLI (A)  Final   Report Status 05/10/2020 FINAL  Final   Organism ID, Bacteria ESCHERICHIA COLI (A)  Final      Susceptibility   Escherichia coli - MIC*    AMPICILLIN >=32 RESISTANT Resistant     CEFAZOLIN <=4 SENSITIVE Sensitive     CEFEPIME <=0.12 SENSITIVE Sensitive     CEFTRIAXONE <=0.25 SENSITIVE Sensitive     CIPROFLOXACIN <=0.25 SENSITIVE Sensitive     GENTAMICIN <=1 SENSITIVE Sensitive     IMIPENEM <=0.25 SENSITIVE Sensitive     NITROFURANTOIN <=16 SENSITIVE Sensitive     TRIMETH/SULFA <=20 SENSITIVE Sensitive     AMPICILLIN/SULBACTAM >=32 RESISTANT Resistant  PIP/TAZO <=4 SENSITIVE Sensitive     * >=100,000 COLONIES/mL ESCHERICHIA COLI  Resp Panel by RT-PCR (Flu A&B, Covid) Nasopharyngeal Swab     Status: None   Collection Time: 05/07/20  5:07 PM   Specimen: Nasopharyngeal Swab; Nasopharyngeal(NP) swabs in vial transport medium  Result Value Ref Range Status   SARS Coronavirus 2 by RT PCR NEGATIVE NEGATIVE Final    Comment: (NOTE) SARS-CoV-2 target nucleic acids are NOT DETECTED.  The SARS-CoV-2 RNA is generally detectable in upper respiratory specimens during the acute phase of infection. The lowest concentration of SARS-CoV-2 viral copies this assay can detect is 138 copies/mL. A negative result does not preclude SARS-Cov-2 infection and should not be used as the sole basis for treatment or other patient management decisions. A negative result may  occur with  improper specimen collection/handling, submission of specimen other than nasopharyngeal swab, presence of viral mutation(s) within the areas targeted by this assay, and inadequate number of viral copies(<138 copies/mL). A negative result must be combined with clinical observations, patient history, and epidemiological information. The expected result is Negative.  Fact Sheet for Patients:  BloggerCourse.com  Fact Sheet for Healthcare Providers:  SeriousBroker.it  This test is no t yet approved or cleared by the [place] FDA and  has been authorized for detection and/or diagnosis of SARS-CoV-2 by FDA under an Emergency Use Authorization (EUA). This EUA will remain  in effect (meaning this test can be used) for the duration of the COVID-19 declaration under Section 564(b)(1) of the Act, 21 U.S.C.section 360bbb-3(b)(1), unless the authorization is terminated  or revoked sooner.       Influenza A by PCR NEGATIVE NEGATIVE Final   Influenza B by PCR NEGATIVE NEGATIVE Final    Comment: (NOTE) The Xpert Xpress SARS-CoV-2/FLU/RSV plus assay is intended as an aid in the diagnosis of influenza from Nasopharyngeal swab specimens and should not be used as a sole basis for treatment. Nasal washings and aspirates are unacceptable for Xpert Xpress SARS-CoV-2/FLU/RSV testing.  Fact Sheet for Patients: BloggerCourse.com  Fact Sheet for Healthcare Providers: SeriousBroker.it  This test is not yet approved or cleared by the [place] FDA and has been authorized for detection and/or diagnosis of SARS-CoV-2 by FDA under an Emergency Use Authorization (EUA). This EUA will remain in effect (meaning this test can be used) for the duration of the COVID-19 declaration under Section 564(b)(1) of the Act, 21 U.S.C. section 360bbb-3(b)(1), unless the authorization is terminated  or revoked.  Performed at [facility], [address] [place], [place] [address]   Culture, blood (single)     Status: None   Collection Time: 05/07/20  5:15 PM   Specimen: BLOOD LEFT WRIST  Result Value Ref Range Status   Specimen Description   Final    BLOOD LEFT WRIST Performed at [facility], [address] [place], [place] [address]    Special Requests   Final    BOTTLES DRAWN AEROBIC AND ANAEROBIC Blood Culture adequate volume Performed at [facility], [address] [place], [place] [address]    Culture   Final    NO GROWTH 5 DAYS Performed at [facility], [address] [place], [place] [address]    Report Status 05/12/2020 FINAL  Final    Radiology Reports DG Chest 1 View  Result Date: 05/07/2020 CLINICAL DATA:  Sacral wound.  No fever or cough. EXAM: CHEST  1 VIEW COMPARISON:  11/22/2019 FINDINGS:  Cardiac silhouette is normal in size. No mediastinal or hilar masses or evidence of adenopathy. Clear lungs.  No pleural effusion or pneumothorax. Skeletal structures are demineralized but grossly intact. IMPRESSION: No active disease. Electronically Signed   By: Amie Portland M.D.   On: 05/07/2020 18:14   DG Pelvis 1-2 Views  Result Date: 05/07/2020 CLINICAL DATA:  Sacral wound.  Possible osteomyelitis. EXAM: PELVIS - 1-2 VIEW COMPARISON:  None. FINDINGS: Sacrum partly obscured by overlying bowel gas and stool. No fracture. No bone lesion. No visible bone resorption to suggest osteomyelitis. Hip joints, SI joints and symphysis pubis are normally spaced and aligned. Soft tissues are unremarkable. IMPRESSION: 1. No fracture, bone lesion or evidence of osteomyelitis. Mid to lower sacrum not well visualized. Electronically Signed   By: Amie Portland M.D.   On: 05/07/2020 18:15   CT HEAD WO CONTRAST  Result Date: 05/08/2020 CLINICAL DATA:  Mental status change, unknown cause. Additional provided: Patient admitted with wound infection,  dementia. EXAM: CT HEAD WITHOUT CONTRAST TECHNIQUE: Contiguous axial images were obtained from the base of the skull through the vertex without intravenous contrast. COMPARISON:  Prior noncontrast head CT examination 11/22/2019 and earlier. FINDINGS: Brain: Mild-to-moderate cerebral atrophy with a medial temporal lobe predominance. Advanced ill-defined hypoattenuation within the cerebral white matter is nonspecific, but compatible chronic small vessel ischemic disease. There is no acute intracranial hemorrhage. No demarcated cortical infarct. No extra-axial fluid collection. No evidence of intracranial mass. No midline shift. Vascular: No hyperdense vessel.  Atherosclerotic calcifications. Skull: Normal. Negative for fracture or focal lesion. Sinuses/Orbits: Visualized orbits show no acute finding. Minimal ethmoid sinus mucosal thickening. Other: Trace bilateral mastoid effusions. IMPRESSION: No evidence of acute intracranial abnormality. Mild/moderate cerebral atrophy with a medial temporal lobe predominance. Advanced chronic small vessel ischemic disease. Small bilateral mastoid effusions. Electronically Signed   By: Jackey Loge DO   On: 05/08/2020 16:47     Time Spent in minutes  30     Laverna Peace M.D on 05/13/2020 at 9:20 PM  To page go to [email or web address] - password [facility]

## 2020-05-14 LAB — CBC
HCT: 35.2 % — ABNORMAL LOW (ref 36.0–46.0)
Hemoglobin: 10.9 g/dL — ABNORMAL LOW (ref 12.0–15.0)
MCH: 27 pg (ref 26.0–34.0)
MCHC: 31 g/dL (ref 30.0–36.0)
MCV: 87.3 fL (ref 80.0–100.0)
Platelets: 318 10*3/uL (ref 150–400)
RBC: 4.03 MIL/uL (ref 3.87–5.11)
RDW: 14 % (ref 11.5–15.5)
WBC: 14.5 10*3/uL — ABNORMAL HIGH (ref 4.0–10.5)
nRBC: 0 % (ref 0.0–0.2)

## 2020-05-14 NOTE — Progress Notes (Signed)
[facility]  PROGRESS NOTE  Dimitra Woodstock OVF:[id number] DOB: Jun 26, 1933 DOA: 05/07/2020 PCP: Patient, No Pcp Per Admit date - 05/07/2020   Admitting Physician Orland Mustard, MD  Outpatient Primary MD for the patient is Patient, No Pcp Per  LOS - 7 Brief Narrative  Ms. Hueber is a 86 year old female with medical history significant for Alzheimer's dementia, Sacral pressure injury being managed at her memory care unit at [facility], during evaluation at [facility] wound care center on 11/23 staged as stage IV with recommendations of wet-to-dry dressing and negative pressure wound therapy via wound VAC. Per daughter the wound recommendations were not being followed,  daughter brought her to ER due to concerns of it appearing worse.  During hospital course has remained afebrile hemodynamically stable, blood cultures unremarkable has slight white count of 13.3 on admission otherwise no signs or symptoms of sepsis UA was positive for nitrites and urine culture with E. coli (only resistant to ampicillin and Unasyn).  Superficial culture of sacral ulcer grew MRSA, E. coli, Proteus mirabilis.  Pelvic x-ray showed no evidence of osteomyelitis of mid to lower sacrum, chest x-ray unremarkable.  Patient was started on vancomycin and cefazolin before deescalation to cefdinir and doxycycline  Subjective  No complaints today. No acute events overnight  A & P  Sacral pressure ulcer stage IV, present on admission.  Patient tolerating hydrotherapy by physical therapy (pulsatile lavage and suction) for continued debridement, no green exudate on 12/11 showing signs of partial granulation, fibrinous exudate still present at base, PT still recommending daily hydrotherapy to assist with debridement and wound healing.  Pelvis x-ray on admission showed no evidence of osteomyelitis.  No changes in WBC or fever, --closely monitor to determine need to broaden out to pseudomonal coverage -Air mattress -Continue  hydrotherapy by PT daily, will still benefit from continued debridement to to assist with wound healing -continue following wound healing/supplementation recommended by dietary -Continue Santyl for enzymatic debridement per wound recommendations  Cellulitis of sacral ulcer.  Osteomyelitis ruled out on admission.  With imaging and inability to probe to bone.  Had mild leukocytosis on admission and white count stable at 14.5, otherwise has remained afebrile and no signs of sepsis.  Blood cultures are unremarkable.  Wound cultures grew MRSA, E. coli, Proteus -Currently tolerating cefdinir and p.o. doxycycline --low threshold to increase back to IV if febrile, or new leukocytosis, in particular pseudomonal coverage given more green in color per CT  Acute metabolic encephalopathy, greatly improved. Likely being most driven from infection in setting of known dementia. Much improved today now alert to self and place. Following commands --continue antibiotics mentioned above --delirium precautions  E.Coli UTI. No current symptoms.  --continue cefdinir  Alzheimer's dementia with behavior disturbances, stable.  Alert to self and place this a.m. which is improved given concern for acute metabolic encephalopathy in the setting of infection due to confusion on admission -Continue Zoloft, Risperdal, Aricept, Namenda -Delirium precautions  B12 deficiency -Continue vitamin B12, multivitamin  Normocytic anemia. Iron panel seems consistent with anemia of chronic disease ( elevated ferritin, low iron). B12 and folate wnl. Hgb stable at 11. No current bleeding  Non ambulatory. Since August of this year has been wheelchair dependent . Required 2 person assist per PT evaluation.  -PT recommends SNF, TOC arranging for LTAC to help with level of wound care, will discuss with daughter      Family Communication  : Spoke with daughter Oralia Rud at bedside  Code Status : DNR, discussed on  day of  admission  Disposition Plan  :  Patient is from memory care unit. Anticipated d/c date: 2 to 3 days. Barriers to d/c or necessity for inpatient status:  Needs daily PT hydrotherapy for continued debridement of pressure ulcer. PT recommends SNF but per TOC no skilled nursing facility able to facilitate hydrotherapy, per PT patient still requires daily frequency for therapy to assist in debridement and improvement in wound healing given exudate still present  Consults  : Wound care  Procedures  : None  DVT Prophylaxis  :  Lovenox  MDM: The below labs and imaging reports were reviewed and summarized above.  Medication management as above.  Lab Results  Component Value Date   PLT 318 05/14/2020    Diet :  Diet Order            DIET DYS 3 Room service appropriate? Yes; Fluid consistency: Thin  Diet effective now                  Inpatient Medications Scheduled Meds: . (feeding supplement) PROSource Plus  30 mL Oral BID BM  . vitamin C  500 mg Oral Daily  . cefdinir  300 mg Oral Q12H  . cholecalciferol  5,000 Units Oral Daily  . collagenase   Topical Daily  . donepezil  10 mg Oral QHS  . doxycycline  100 mg Oral Q12H  . enoxaparin (LOVENOX) injection  40 mg Subcutaneous Daily  . feeding supplement  237 mL Oral BID BM  . melatonin  3 mg Oral QHS  . memantine  10 mg Oral Daily  . multivitamin with minerals  1 tablet Oral Daily  . polyvinyl alcohol  1 drop Both Eyes BID  . risperiDONE  0.75 mg Oral QHS  . sertraline  125 mg Oral Daily  . vitamin B-12  500 mcg Oral Daily  . zinc sulfate  220 mg Oral Daily   Continuous Infusions: . sodium chloride Stopped (05/10/20 2314)   PRN Meds:.sodium chloride, acetaminophen, antiseptic oral rinse, LORazepam, polyvinyl alcohol  Antibiotics  :   Anti-infectives (From admission, onward)   Start     Dose/Rate Route Frequency Ordered Stop   05/10/20 2200  cefdinir (OMNICEF) capsule 300 mg        300 mg Oral Every 12 hours 05/10/20 1906      05/10/20 2200  doxycycline (VIBRA-TABS) tablet 100 mg        100 mg Oral Every 12 hours 05/10/20 1906     05/08/20 1200  vancomycin (VANCOREADY) IVPB 1250 mg/250 mL  Status:  Discontinued        1,250 mg 166.7 mL/hr over 90 Minutes Intravenous Every 24 hours 05/08/20 0143 05/10/20 1905   05/08/20 0200  piperacillin-tazobactam (ZOSYN) IVPB 3.375 g  Status:  Discontinued        3.375 g 12.5 mL/hr over 240 Minutes Intravenous Every 8 hours 05/08/20 0143 05/10/20 1905   05/07/20 1815  ceFAZolin (ANCEF) IVPB 1 g/50 mL premix        1 g 100 mL/hr over 30 Minutes Intravenous  Once 05/07/20 1812 05/07/20 1900   05/07/20 1815  vancomycin (VANCOCIN) IVPB 1000 mg/200 mL premix        1,000 mg 200 mL/hr over 60 Minutes Intravenous  Once 05/07/20 1814 05/07/20 2037       Objective   Vitals:   05/13/20 1406 05/13/20 2046 05/14/20 0510 05/14/20 1335  BP: 131/64 (!) 145/63 (!) 144/72 (!) 151/57  Pulse: 75 78  77 66  Resp: 18 18 18 14   Temp: 98.8 F (37.1 C) 98.4 F (36.9 C) 98 F (36.7 C) 98.1 F (36.7 C)  TempSrc: Oral   Oral  SpO2: 95% 95% 96% 95%  Weight:   62.6 kg   Height:        SpO2: 95 %  Wt Readings from Last 3 Encounters:  05/14/20 62.6 kg  11/22/19 75.7 kg  10/07/17 63 kg     Intake/Output Summary (Last 24 hours) at 05/14/2020 1618 Last data filed at 05/14/2020 1256 Gross per 24 hour  Intake 180 ml  Output 750 ml  Net -570 ml    Physical Exam:  Awake, alert to self only today, in no acute distress Normal respiratory effort on room air Regular rate and rhythm, no peripheral edema, no murmurs appreciated Abdomen soft, nondistended, nontender         I have personally reviewed the following:   Data Reviewed:  CBC Recent Labs  Lab 05/07/20 1715 05/08/20 0616 05/12/20 0505 05/14/20 0600  WBC 13.3* 13.9* 12.2* 14.5*  HGB 11.2* 11.3* 11.4* 10.9*  HCT 34.4* 34.2* 36.2 35.2*  PLT 295 302 315 318  MCV 83.7 83.8 86.6 87.3  MCH 27.3 27.7 27.3 27.0   MCHC 32.6 33.0 31.5 31.0  RDW 13.3 13.2 13.8 14.0  LYMPHSABS 2.6 1.8  --   --   MONOABS 0.8 0.8  --   --   EOSABS 0.1 0.1  --   --   BASOSABS 0.1 0.0  --   --     Chemistries  Recent Labs  Lab 05/07/20 1715 05/08/20 0616 05/09/20 0532 05/10/20 0549 05/10/20 0853 05/11/20 0658  NA 137 141  --   --  141  --   K 3.0* 3.4*  --   --  3.5  --   CL 96* 99  --   --  101  --   CO2 30 30  --   --  28  --   GLUCOSE 91 101*  --   --  96  --   BUN 12 10  --   --  10  --   CREATININE 0.60 0.68 0.70 0.77 0.80 0.81  CALCIUM 8.8* 8.9  --   --  8.8*  --   MG  --  1.9  --   --   --   --   AST 15 16  --   --   --   --   ALT 12 12  --   --   --   --   ALKPHOS 90 89  --   --   --   --   BILITOT 0.2* 0.6  --   --   --   --    ------------------------------------------------------------------------------------------------------------------ No results for input(s): CHOL, HDL, LDLCALC, TRIG, CHOLHDL, LDLDIRECT in the last 72 hours.  No results found for: HGBA1C ------------------------------------------------------------------------------------------------------------------ No results for input(s): TSH, T4TOTAL, T3FREE, THYROIDAB in the last 72 hours.  Invalid input(s): FREET3 ------------------------------------------------------------------------------------------------------------------ No results for input(s): VITAMINB12, FOLATE, FERRITIN, TIBC, IRON, RETICCTPCT in the last 72 hours.  Coagulation profile No results for input(s): INR, PROTIME in the last 168 hours.  No results for input(s): DDIMER in the last 72 hours.  Cardiac Enzymes No results for input(s): CKMB, TROPONINI, MYOGLOBIN in the last 168 hours.  Invalid input(s): CK ------------------------------------------------------------------------------------------------------------------ No results found for: BNP  Micro Results Recent Results (from the past 240 hour(s))  Wound or Superficial Culture  Status: None    Collection Time: 05/07/20  3:22 PM   Specimen: Sacral; Wound  Result Value Ref Range Status   Specimen Description   Final    SACRAL Performed at [facility] [place], [address] [place], [place] [address]    Special Requests   Final    Normal Performed at [facility] [place], [address] [place], [place] [address]    Gram Stain   Final    FEW WBC PRESENT, PREDOMINANTLY PMN MODERATE GRAM NEGATIVE RODS FEW GRAM POSITIVE COCCI IN PAIRS IN CLUSTERS RARE GRAM POSITIVE RODS Performed at [facility], [address] [place], [place] [address]    Culture   Final    FEW STAPHYLOCOCCUS AUREUS FEW PROTEUS MIRABILIS MODERATE ESCHERICHIA COLI    Report Status 05/11/2020 FINAL  Final   Organism ID, Bacteria STAPHYLOCOCCUS AUREUS  Final   Organism ID, Bacteria ESCHERICHIA COLI  Final   Organism ID, Bacteria PROTEUS MIRABILIS  Final      Susceptibility   Escherichia coli - MIC*    AMPICILLIN >=32 RESISTANT Resistant     CEFAZOLIN <=4 SENSITIVE Sensitive     CEFEPIME <=0.12 SENSITIVE Sensitive     CEFTAZIDIME <=1 SENSITIVE Sensitive     CEFTRIAXONE <=0.25 SENSITIVE Sensitive     CIPROFLOXACIN <=0.25 SENSITIVE Sensitive     GENTAMICIN <=1 SENSITIVE Sensitive     IMIPENEM <=0.25 SENSITIVE Sensitive     TRIMETH/SULFA <=20 SENSITIVE Sensitive     AMPICILLIN/SULBACTAM >=32 RESISTANT Resistant     PIP/TAZO <=4 SENSITIVE Sensitive     * MODERATE ESCHERICHIA COLI   Proteus mirabilis - MIC*    AMPICILLIN >=32 RESISTANT Resistant     CEFAZOLIN 8 SENSITIVE Sensitive     CEFEPIME <=0.12 SENSITIVE Sensitive     CEFTAZIDIME <=1 SENSITIVE Sensitive     CEFTRIAXONE <=0.25 SENSITIVE Sensitive     CIPROFLOXACIN >=4 RESISTANT Resistant     GENTAMICIN <=1 SENSITIVE Sensitive     IMIPENEM 8 INTERMEDIATE Intermediate     TRIMETH/SULFA >=320 RESISTANT Resistant     AMPICILLIN/SULBACTAM <=2 SENSITIVE Sensitive     PIP/TAZO <=4 SENSITIVE Sensitive     * FEW PROTEUS MIRABILIS    Staphylococcus aureus - MIC*    CIPROFLOXACIN >=8 RESISTANT Resistant     ERYTHROMYCIN >=8 RESISTANT Resistant     GENTAMICIN <=0.5 SENSITIVE Sensitive     OXACILLIN >=4 RESISTANT Resistant     TETRACYCLINE <=1 SENSITIVE Sensitive     VANCOMYCIN <=0.5 SENSITIVE Sensitive     TRIMETH/SULFA <=10 SENSITIVE Sensitive     CLINDAMYCIN >=8 RESISTANT Resistant     RIFAMPIN <=0.5 SENSITIVE Sensitive     Inducible Clindamycin NEGATIVE Sensitive     * FEW STAPHYLOCOCCUS AUREUS  Urine culture     Status: Abnormal   Collection Time: 05/07/20  4:07 PM   Specimen: Urine, Random  Result Value Ref Range Status   Specimen Description   Final    URINE, RANDOM Performed at [facility] [place], [address] [place], [place] [address]    Special Requests   Final    Normal Performed at [facility] [place], [address] [place], [place] [address]    Culture >=100,000 COLONIES/mL ESCHERICHIA COLI (A)  Final   Report Status 05/10/2020 FINAL  Final   Organism ID, Bacteria ESCHERICHIA COLI (A)  Final      Susceptibility   Escherichia coli - MIC*    AMPICILLIN >=  32 RESISTANT Resistant     CEFAZOLIN <=4 SENSITIVE Sensitive     CEFEPIME <=0.12 SENSITIVE Sensitive     CEFTRIAXONE <=0.25 SENSITIVE Sensitive     CIPROFLOXACIN <=0.25 SENSITIVE Sensitive     GENTAMICIN <=1 SENSITIVE Sensitive     IMIPENEM <=0.25 SENSITIVE Sensitive     NITROFURANTOIN <=16 SENSITIVE Sensitive     TRIMETH/SULFA <=20 SENSITIVE Sensitive     AMPICILLIN/SULBACTAM >=32 RESISTANT Resistant     PIP/TAZO <=4 SENSITIVE Sensitive     * >=100,000 COLONIES/mL ESCHERICHIA COLI  Resp Panel by RT-PCR (Flu A&B, Covid) Nasopharyngeal Swab     Status: None   Collection Time: 05/07/20  5:07 PM   Specimen: Nasopharyngeal Swab; Nasopharyngeal(NP) swabs in vial transport medium  Result Value Ref Range Status   SARS Coronavirus 2 by RT PCR NEGATIVE NEGATIVE Final    Comment: (NOTE) SARS-CoV-2 target nucleic acids are NOT  DETECTED.  The SARS-CoV-2 RNA is generally detectable in upper respiratory specimens during the acute phase of infection. The lowest concentration of SARS-CoV-2 viral copies this assay can detect is 138 copies/mL. A negative result does not preclude SARS-Cov-2 infection and should not be used as the sole basis for treatment or other patient management decisions. A negative result may occur with  improper specimen collection/handling, submission of specimen other than nasopharyngeal swab, presence of viral mutation(s) within the areas targeted by this assay, and inadequate number of viral copies(<138 copies/mL). A negative result must be combined with clinical observations, patient history, and epidemiological information. The expected result is Negative.  Fact Sheet for Patients:  BloggerCourse.com  Fact Sheet for Healthcare Providers:  SeriousBroker.it  This test is no t yet approved or cleared by the [place] FDA and  has been authorized for detection and/or diagnosis of SARS-CoV-2 by FDA under an Emergency Use Authorization (EUA). This EUA will remain  in effect (meaning this test can be used) for the duration of the COVID-19 declaration under Section 564(b)(1) of the Act, 21 U.S.C.section 360bbb-3(b)(1), unless the authorization is terminated  or revoked sooner.       Influenza A by PCR NEGATIVE NEGATIVE Final   Influenza B by PCR NEGATIVE NEGATIVE Final    Comment: (NOTE) The Xpert Xpress SARS-CoV-2/FLU/RSV plus assay is intended as an aid in the diagnosis of influenza from Nasopharyngeal swab specimens and should not be used as a sole basis for treatment. Nasal washings and aspirates are unacceptable for Xpert Xpress SARS-CoV-2/FLU/RSV testing.  Fact Sheet for Patients: BloggerCourse.com  Fact Sheet for Healthcare Providers: SeriousBroker.it  This test is not yet  approved or cleared by the [place] FDA and has been authorized for detection and/or diagnosis of SARS-CoV-2 by FDA under an Emergency Use Authorization (EUA). This EUA will remain in effect (meaning this test can be used) for the duration of the COVID-19 declaration under Section 564(b)(1) of the Act, 21 U.S.C. section 360bbb-3(b)(1), unless the authorization is terminated or revoked.  Performed at [facility], [address] [place], [place] [address]   Culture, blood (single)     Status: None   Collection Time: 05/07/20  5:15 PM   Specimen: BLOOD LEFT WRIST  Result Value Ref Range Status   Specimen Description   Final    BLOOD LEFT WRIST Performed at [facility], [address] [place], [place] [address]    Special Requests   Final    BOTTLES DRAWN AEROBIC AND ANAEROBIC Blood Culture adequate volume Performed at [facility], [address] [place], [place] [address]    Culture   Final    NO GROWTH 5 DAYS Performed at [facility], [address], [place], [place] [address]    Report Status 05/12/2020 FINAL  Final    Radiology Reports DG Chest 1 View  Result Date: 05/07/2020 CLINICAL DATA:  Sacral wound.  No fever or cough. EXAM: CHEST  1 VIEW COMPARISON:  11/22/2019 FINDINGS: Cardiac silhouette is normal in size. No mediastinal or hilar masses or evidence of adenopathy. Clear lungs.  No pleural effusion or pneumothorax. Skeletal structures are demineralized but grossly intact. IMPRESSION: No active disease. Electronically Signed   By: Amie Portland M.D.   On: 05/07/2020 18:14   DG Pelvis 1-2 Views  Result Date: 05/07/2020 CLINICAL DATA:  Sacral wound.  Possible osteomyelitis. EXAM: PELVIS - 1-2 VIEW COMPARISON:  None. FINDINGS: Sacrum partly obscured by overlying bowel gas and stool. No fracture. No bone lesion. No visible bone resorption to suggest osteomyelitis. Hip joints, SI joints and symphysis pubis are normally spaced  and aligned. Soft tissues are unremarkable. IMPRESSION: 1. No fracture, bone lesion or evidence of osteomyelitis. Mid to lower sacrum not well visualized. Electronically Signed   By: Amie Portland M.D.   On: 05/07/2020 18:15   CT HEAD WO CONTRAST  Result Date: 05/08/2020 CLINICAL DATA:  Mental status change, unknown cause. Additional provided: Patient admitted with wound infection, dementia. EXAM: CT HEAD WITHOUT CONTRAST TECHNIQUE: Contiguous axial images were obtained from the base of the skull through the vertex without intravenous contrast. COMPARISON:  Prior noncontrast head CT examination 11/22/2019 and earlier. FINDINGS: Brain: Mild-to-moderate cerebral atrophy with a medial temporal lobe predominance. Advanced ill-defined hypoattenuation within the cerebral white matter is nonspecific, but compatible chronic small vessel ischemic disease. There is no acute intracranial hemorrhage. No demarcated cortical infarct. No extra-axial fluid collection. No evidence of intracranial mass. No midline shift. Vascular: No hyperdense vessel.  Atherosclerotic calcifications. Skull: Normal. Negative for fracture or focal lesion. Sinuses/Orbits: Visualized orbits show no acute finding. Minimal ethmoid sinus mucosal thickening. Other: Trace bilateral mastoid effusions. IMPRESSION: No evidence of acute intracranial abnormality. Mild/moderate cerebral atrophy with a medial temporal lobe predominance. Advanced chronic small vessel ischemic disease. Small bilateral mastoid effusions. Electronically Signed   By: Jackey Loge DO   On: 05/08/2020 16:47     Time Spent in minutes  30     Laverna Peace M.D on 05/14/2020 at 4:18 PM  To page go to [email or web address] - password [facility]

## 2020-05-14 NOTE — Progress Notes (Signed)
   05/14/20 1500  Subjective Assessment  Subjective it hurts   Wound progressing, no greenish exudate noted today.    Patient and Family Stated Goals per daughter, to heal wound, walk again.  Date of Onset  (present on adm.)  Prior Treatments seen in Wound clinic, dressing changes   Evaluation and Treatment  Evaluation and Treatment Procedures Explained to Patient/Family Yes  Evaluation and Treatment Procedures agreed to (daughter)  Pressure Injury 05/09/20 Sacrum Right;Mid Stage 4 - Full thickness tissue loss with exposed bone, tendon or muscle. PT ONLY- wound is on the right guteal area, above gluteal cleft.   Date First Assessed/Time First Assessed: 05/09/20 1100   Location: Sacrum  Location Orientation: Right;Mid  Staging: Stage 4 - Full thickness tissue loss with exposed bone, tendon or muscle.  Wound Description (Comments): PT ONLY- wound is on the righ...  Dressing Type Foam - Lift dressing to assess site every shift;Other (Comment);Normal saline moist dressing (santyl)  Dressing Changed  Dressing Change Frequency Daily  State of Healing Early/partial granulation  Site / Wound Assessment Yellow;Red;Painful  % Wound base Red or Granulating 55%  % Wound base Yellow/Fibrinous Exudate 45%  Peri-wound Assessment Intact  Wound Length (cm) 3.5 cm  Wound Width (cm) 3 cm  Wound Depth (cm) 2.5 cm  Wound Surface Area (cm^2) 10.5 cm^2  Wound Volume (cm^3) 26.25 cm^3  Undermining (cm) 2.5 (measurements from eval)  Margins Unattached edges (unapproximated)  Drainage Amount Minimal  Drainage Description Serosanguineous;No odor  Treatment Cleansed;Debridement (Selective);Hydrotherapy (Pulse lavage);Off loading;Packing (Saline gauze)  Hydrotherapy  Pulsed lavage therapy - wound location right, lateral to  sacrum  Pulsed Lavage with Suction (psi) 4 psi  Pulsed Lavage with Suction - Normal Saline Used 1000 mL  Pulsed Lavage Tip Tip with splash shield  Selective Debridement  Selective  Debridement - Location right sacral/gluteal area  Selective Debridement - Tools Used Forceps;Scissors  Selective Debridement - Tissue Removed slough  Wound Therapy - Assess/Plan/Recommendations  Wound Therapy - Clinical Statement pt alert, assists minimally with bed mobility, less discomfort with pulsed lavage. no greenish exudate noted today. Dr Caleb Popp took pic of wound during hydrotherapy.  Wound Therapy - Functional Problem List non ambulatory  Factors Delaying/Impairing Wound Healing Incontinence;Immobility  Hydrotherapy Plan Debridement;Dressing change;Pulsatile lavage with suction;Patient/family education  Wound Therapy - Frequency 6X / week  Wound Therapy - Current Recommendations PT;Case manager/social work  Wound Therapy - Follow Up Recommendations Skilled nursing facility (LTACH)  Wound Plan PLS 6 x week with drebridement and dressing change  Wound Therapy Goals - Improve the function of patient's integumentary system by progressing the wound(s) through the phases of wound healing by:  Decrease Necrotic Tissue to 25  Decrease Necrotic Tissue - Progress Progressing toward goal  Increase Granulation Tissue to 75  Increase Granulation Tissue - Progress Progressing toward goal  Improve Drainage Characteristics Min  Improve Drainage Characteristics - Progress Progressing toward goal  Goals/treatment plan/discharge plan were made with and agreed upon by patient/family Yes  Time For Goal Achievement 2 weeks  Wound Therapy - Potential for Goals Fair

## 2020-05-14 NOTE — Plan of Care (Signed)
  Problem: Health Behavior/Discharge Planning: Goal: Ability to manage health-related needs will improve Outcome: Progressing   Problem: Clinical Measurements: Goal: Ability to maintain clinical measurements within normal limits will improve Outcome: Progressing   

## 2020-05-15 LAB — CREATININE, SERUM
Creatinine, Ser: 0.83 mg/dL (ref 0.44–1.00)
GFR, Estimated: >60 mL/min (ref >60)

## 2020-05-15 NOTE — Progress Notes (Signed)
[facility]  PROGRESS NOTE  Faye RamsayBarbara Lobo WUJ:[id number] DOB: August 24, 1933 DOA: 05/07/2020 PCP: Patient, No Pcp Per Admit date - 05/07/2020   Admitting Physician Orland MustardAllison Wolfe, MD  Outpatient Primary MD for the patient is Patient, No Pcp Per  LOS - 8 Brief Narrative  [name] is a 86 year old female with medical history significant for Alzheimer's dementia, Sacral pressure injury [facility], during [facility] on 11/23 staged as stage IV with recommendations of wet-to-dry dressing and negative pressure wound therapy via wound VAC. Per daughter the wound recommendations were not being followed,  daughter brought her to ER due to concerns of it appearing worse.  During hospital course has remained afebrile hemodynamically stable, blood cultures unremarkable has slight white count of 13.3 on admission otherwise no signs or symptoms of sepsis UA was positive for nitrites and urine culture with E. coli (only resistant to ampicillin and Unasyn).  Superficial culture of sacral ulcer grew MRSA, E. coli, Proteus mirabilis.  Pelvic x-ray showed no evidence of osteomyelitis of mid to lower sacrum, chest x-ray unremarkable.  Patient was started on vancomycin and cefazolin before deescalation to cefdinir and doxycycline  Subjective  No complaints today. No acute events overnight. Continues to pick her face  A & P  Sacral pressure ulcer stage IV, present on admission.  Patient tolerating hydrotherapy by physical therapy (pulsatile lavage and suction) for continued debridement, no green exudate on 12/11 showing signs of partial granulation, fibrinous exudate still present at base, PT still recommending daily hydrotherapy to assist with debridement and wound healing.  Pelvis x-ray on admission showed no evidence of osteomyelitis.  No changes in WBC or fever, --closely monitor to determine need to broaden out to pseudomonal coverage -Air  mattress -Continue hydrotherapy by PT daily, will still benefit from continued debridement to to assist with wound healing -continue following wound healing/supplementation recommended by dietary -Continue Santyl for enzymatic debridement per wound recommendations  Cellulitis of sacral ulcer.  Osteomyelitis ruled out on admission.  With imaging and inability to probe to bone.  Had mild leukocytosis on admission and white count stable at 14.5, otherwise has remained afebrile and no signs of sepsis.  Blood cultures are unremarkable.  Wound cultures grew MRSA, E. coli, Proteus -Currently tolerating cefdinir and p.o. doxycycline --low threshold to increase back to IV if febrile, or new leukocytosis, in particular pseudomonal coverage given more green in color per CT  Acute metabolic encephalopathy, greatly improved. Likely being most driven from infection in setting of known dementia. Much improved today now alert to self and place. Following commands --continue antibiotics mentioned above --delirium precautions  E.Coli UTI. No current symptoms.  --continue cefdinir  Alzheimer's dementia with behavior disturbances, stable.  Alert to self and place this a.m. which is improved given concern for acute metabolic encephalopathy in the setting of infection due to confusion on admission -Continue Zoloft, Risperdal, Aricept, Namenda -Delirium precautions  B12 deficiency -Continue vitamin B12, multivitamin  Normocytic anemia. Iron panel seems consistent with anemia of chronic disease ( elevated ferritin, low iron). B12 and folate wnl. Hgb stable at 11. No current bleeding  Non ambulatory. Since August of this year has been wheelchair dependent . Required 2 person assist per PT evaluation.  -PT recommends SNF, TOC arranging for LTAC to help with level of wound care, will discuss with daughter      Family Communication  : Spoke with daughter [name], [name] at bedside on 12/11  Code Status : DNR,  discussed on day of admission  Disposition Plan  :  Patient is from memory care unit. Anticipated d/c date: 1 to 2 days. Barriers to d/c or necessity for inpatient status:  Needs daily PT hydrotherapy for continued debridement of pressure ulcer. PT recommends SNF but per TOC no skilled nursing facility able to facilitate hydrotherapy, per PT patient still requires daily frequency for therapy to assist in debridement and improvement in wound healing given exudate still present  Consults  : Wound care  Procedures  : None  DVT Prophylaxis  :  Lovenox  MDM: The below labs and imaging reports were reviewed and summarized above.  Medication management as above.  Lab Results  Component Value Date   PLT 318 05/14/2020    Diet :  Diet Order            DIET DYS 3 Room service appropriate? Yes; Fluid consistency: Thin  Diet effective now                  Inpatient Medications Scheduled Meds: . (feeding supplement) PROSource Plus  30 mL Oral BID BM  . vitamin C  500 mg Oral Daily  . cefdinir  300 mg Oral Q12H  . cholecalciferol  5,000 Units Oral Daily  . collagenase   Topical Daily  . donepezil  10 mg Oral QHS  . doxycycline  100 mg Oral Q12H  . enoxaparin (LOVENOX) injection  40 mg Subcutaneous Daily  . feeding supplement  237 mL Oral BID BM  . melatonin  3 mg Oral QHS  . memantine  10 mg Oral Daily  . multivitamin with minerals  1 tablet Oral Daily  . polyvinyl alcohol  1 drop Both Eyes BID  . risperiDONE  0.75 mg Oral QHS  . sertraline  125 mg Oral Daily  . vitamin B-12  500 mcg Oral Daily  . zinc sulfate  220 mg Oral Daily   Continuous Infusions: . sodium chloride Stopped (05/10/20 2314)   PRN Meds:.sodium chloride, acetaminophen, antiseptic oral rinse, LORazepam, polyvinyl alcohol  Antibiotics  :   Anti-infectives (From admission, onward)   Start     Dose/Rate Route Frequency Ordered Stop   05/10/20 2200  cefdinir (OMNICEF) capsule 300 mg        300 mg Oral Every 12  hours 05/10/20 1906     05/10/20 2200  doxycycline (VIBRA-TABS) tablet 100 mg        100 mg Oral Every 12 hours 05/10/20 1906     05/08/20 1200  vancomycin (VANCOREADY) IVPB 1250 mg/250 mL  Status:  Discontinued        1,250 mg 166.7 mL/hr over 90 Minutes Intravenous Every 24 hours 05/08/20 0143 05/10/20 1905   05/08/20 0200  piperacillin-tazobactam (ZOSYN) IVPB 3.375 g  Status:  Discontinued        3.375 g 12.5 mL/hr over 240 Minutes Intravenous Every 8 hours 05/08/20 0143 05/10/20 1905   05/07/20 1815  ceFAZolin (ANCEF) IVPB 1 g/50 mL premix        1 g 100 mL/hr over 30 Minutes Intravenous  Once 05/07/20 1812 05/07/20 1900   05/07/20 1815  vancomycin (VANCOCIN) IVPB 1000 mg/200 mL premix        1,000 mg 200 mL/hr over 60 Minutes Intravenous  Once 05/07/20 1814 05/07/20 2037       Objective   Vitals:   05/14/20 1335 05/14/20 2157 05/15/20 0552 05/15/20 1401  BP: (!) 151/57 (!) 144/56 Marland Kitchen)  151/69 (!) 139/54  Pulse: 66 73 65 76  Resp: 14 18 16 20   Temp: 98.1 F (36.7 C) 98.7 F (37.1 C) 97.7 F (36.5 C) 98.3 F (36.8 C)  TempSrc: Oral Oral Oral   SpO2: 95% 94% 94% 95%  Weight:      Height:        SpO2: 95 %  Wt Readings from Last 3 Encounters:  05/14/20 62.6 kg  11/22/19 75.7 kg  10/07/17 63 kg     Intake/Output Summary (Last 24 hours) at 05/15/2020 1832 Last data filed at 05/15/2020 1700 Gross per 24 hour  Intake 360 ml  Output 550 ml  Net -190 ml    Physical Exam:  Awake, alert to self only today, in no acute distress Normal respiratory effort on room air Regular rate and rhythm, no peripheral edema, no murmurs appreciated Abdomen soft, nondistended, nontender  From physical exam on 12/11         I have personally reviewed the following:   Data Reviewed:  CBC Recent Labs  Lab 05/12/20 0505 05/14/20 0600  WBC 12.2* 14.5*  HGB 11.4* 10.9*  HCT 36.2 35.2*  PLT 315 318  MCV 86.6 87.3  MCH 27.3 27.0  MCHC 31.5 31.0  RDW 13.8 14.0     Chemistries  Recent Labs  Lab 05/09/20 0532 05/10/20 0549 05/10/20 0853 05/11/20 0658 05/15/20 0517  NA  --   --  141  --   --   K  --   --  3.5  --   --   CL  --   --  101  --   --   CO2  --   --  28  --   --   GLUCOSE  --   --  96  --   --   BUN  --   --  10  --   --   CREATININE 0.70 0.77 0.80 0.81 0.83  CALCIUM  --   --  8.8*  --   --    ------------------------------------------------------------------------------------------------------------------ No results for input(s): CHOL, HDL, LDLCALC, TRIG, CHOLHDL, LDLDIRECT in the last 72 hours.  No results found for: HGBA1C ------------------------------------------------------------------------------------------------------------------ No results for input(s): TSH, T4TOTAL, T3FREE, THYROIDAB in the last 72 hours.  Invalid input(s): FREET3 ------------------------------------------------------------------------------------------------------------------ No results for input(s): VITAMINB12, FOLATE, FERRITIN, TIBC, IRON, RETICCTPCT in the last 72 hours.  Coagulation profile No results for input(s): INR, PROTIME in the last 168 hours.  No results for input(s): DDIMER in the last 72 hours.  Cardiac Enzymes No results for input(s): CKMB, TROPONINI, MYOGLOBIN in the last 168 hours.  Invalid input(s): CK ------------------------------------------------------------------------------------------------------------------ No results found for: BNP  Micro Results Recent Results (from the past 240 hour(s))  Wound or Superficial Culture     Status: None   Collection Time: 05/07/20  3:22 PM   Specimen: Sacral; Wound  Result Value Ref Range Status   Specimen Description   Final    SACRAL Performed at [facility], [address] [place], [place]    Special Requests   Final    Normal Performed at [facility], [address] [place], [place]    Gram Stain   Final    FEW WBC  PRESENT, PREDOMINANTLY PMN MODERATE GRAM NEGATIVE RODS FEW GRAM POSITIVE COCCI IN PAIRS IN CLUSTERS RARE GRAM POSITIVE RODS Performed at [facility], [address], [place], [place]    Culture  Final    FEW STAPHYLOCOCCUS AUREUS FEW PROTEUS MIRABILIS MODERATE ESCHERICHIA COLI    Report Status 05/11/2020 FINAL  Final   Organism ID, Bacteria STAPHYLOCOCCUS AUREUS  Final   Organism ID, Bacteria ESCHERICHIA COLI  Final   Organism ID, Bacteria PROTEUS MIRABILIS  Final      Susceptibility   Escherichia coli - MIC*    AMPICILLIN >=32 RESISTANT Resistant     CEFAZOLIN <=4 SENSITIVE Sensitive     CEFEPIME <=0.12 SENSITIVE Sensitive     CEFTAZIDIME <=1 SENSITIVE Sensitive     CEFTRIAXONE <=0.25 SENSITIVE Sensitive     CIPROFLOXACIN <=0.25 SENSITIVE Sensitive     GENTAMICIN <=1 SENSITIVE Sensitive     IMIPENEM <=0.25 SENSITIVE Sensitive     TRIMETH/SULFA <=20 SENSITIVE Sensitive     AMPICILLIN/SULBACTAM >=32 RESISTANT Resistant     PIP/TAZO <=4 SENSITIVE Sensitive     * MODERATE ESCHERICHIA COLI   Proteus mirabilis - MIC*    AMPICILLIN >=32 RESISTANT Resistant     CEFAZOLIN 8 SENSITIVE Sensitive     CEFEPIME <=0.12 SENSITIVE Sensitive     CEFTAZIDIME <=1 SENSITIVE Sensitive     CEFTRIAXONE <=0.25 SENSITIVE Sensitive     CIPROFLOXACIN >=4 RESISTANT Resistant     GENTAMICIN <=1 SENSITIVE Sensitive     IMIPENEM 8 INTERMEDIATE Intermediate     TRIMETH/SULFA >=320 RESISTANT Resistant     AMPICILLIN/SULBACTAM <=2 SENSITIVE Sensitive     PIP/TAZO <=4 SENSITIVE Sensitive     * FEW PROTEUS MIRABILIS   Staphylococcus aureus - MIC*    CIPROFLOXACIN >=8 RESISTANT Resistant     ERYTHROMYCIN >=8 RESISTANT Resistant     GENTAMICIN <=0.5 SENSITIVE Sensitive     OXACILLIN >=4 RESISTANT Resistant     TETRACYCLINE <=1 SENSITIVE Sensitive     VANCOMYCIN <=0.5 SENSITIVE Sensitive     TRIMETH/SULFA <=10 SENSITIVE Sensitive     CLINDAMYCIN >=8 RESISTANT Resistant     RIFAMPIN  <=0.5 SENSITIVE Sensitive     Inducible Clindamycin NEGATIVE Sensitive     * FEW STAPHYLOCOCCUS AUREUS  Urine culture     Status: Abnormal   Collection Time: 05/07/20  4:07 PM   Specimen: Urine, Random  Result Value Ref Range Status   Specimen Description   Final    URINE, RANDOM Performed at [facility], [address] [place], [place] [address]    Special Requests   Final    Normal Performed at [facility], [address] [place], [place] [address]    Culture >=100,000 COLONIES/mL ESCHERICHIA COLI (A)  Final   Report Status 05/10/2020 FINAL  Final   Organism ID, Bacteria ESCHERICHIA COLI (A)  Final      Susceptibility   Escherichia coli - MIC*    AMPICILLIN >=32 RESISTANT Resistant     CEFAZOLIN <=4 SENSITIVE Sensitive     CEFEPIME <=0.12 SENSITIVE Sensitive     CEFTRIAXONE <=0.25 SENSITIVE Sensitive     CIPROFLOXACIN <=0.25 SENSITIVE Sensitive     GENTAMICIN <=1 SENSITIVE Sensitive     IMIPENEM <=0.25 SENSITIVE Sensitive     NITROFURANTOIN <=16 SENSITIVE Sensitive     TRIMETH/SULFA <=20 SENSITIVE Sensitive     AMPICILLIN/SULBACTAM >=32 RESISTANT Resistant     PIP/TAZO <=4 SENSITIVE Sensitive     * >=100,000 COLONIES/mL ESCHERICHIA COLI  Resp Panel by RT-PCR (Flu A&B, Covid) Nasopharyngeal Swab     Status: None   Collection Time: 05/07/20  5:07 PM   Specimen: Nasopharyngeal Swab; Nasopharyngeal(NP) swabs in  vial transport medium  Result Value Ref Range Status   SARS Coronavirus 2 by RT PCR NEGATIVE NEGATIVE Final    Comment: (NOTE) SARS-CoV-2 target nucleic acids are NOT DETECTED.  The SARS-CoV-2 RNA is generally detectable in upper respiratory specimens during the acute phase of infection. The lowest concentration of SARS-CoV-2 viral copies this assay can detect is 138 copies/mL. A negative result does not preclude SARS-Cov-2 infection and should not be used as the sole basis for treatment or other patient management decisions. A negative  result may occur with  improper specimen collection/handling, submission of specimen other than nasopharyngeal swab, presence of viral mutation(s) within the areas targeted by this assay, and inadequate number of viral copies(<138 copies/mL). A negative result must be combined with clinical observations, patient history, and epidemiological information. The expected result is Negative.  Fact Sheet for Patients:  BloggerCourse.com  Fact Sheet for Healthcare Providers:  SeriousBroker.it  This test is no t yet approved or cleared by the [place] FDA and  has been authorized for detection and/or diagnosis of SARS-CoV-2 by FDA under an Emergency Use Authorization (EUA). This EUA will remain  in effect (meaning this test can be used) for the duration of the COVID-19 declaration under Section 564(b)(1) of the Act, 21 U.S.C.section 360bbb-3(b)(1), unless the authorization is terminated  or revoked sooner.       Influenza A by PCR NEGATIVE NEGATIVE Final   Influenza B by PCR NEGATIVE NEGATIVE Final    Comment: (NOTE) The Xpert Xpress SARS-CoV-2/FLU/RSV plus assay is intended as an aid in the diagnosis of influenza from Nasopharyngeal swab specimens and should not be used as a sole basis for treatment. Nasal washings and aspirates are unacceptable for Xpert Xpress SARS-CoV-2/FLU/RSV testing.  Fact Sheet for Patients: BloggerCourse.com  Fact Sheet for Healthcare Providers: SeriousBroker.it  This test is not yet approved or cleared by the [place] FDA and has been authorized for detection and/or diagnosis of SARS-CoV-2 by FDA under an Emergency Use Authorization (EUA). This EUA will remain in effect (meaning this test can be used) for the duration of the COVID-19 declaration under Section 564(b)(1) of the Act, 21 U.S.C. section 360bbb-3(b)(1), unless the authorization is  terminated or revoked.  Performed at [facility], [address] [place], [place] [address]   Culture, blood (single)     Status: None   Collection Time: 05/07/20  5:15 PM   Specimen: BLOOD LEFT WRIST  Result Value Ref Range Status   Specimen Description   Final    BLOOD LEFT WRIST Performed at [facility], [address] [place], [place] [address]    Special Requests   Final    BOTTLES DRAWN AEROBIC AND ANAEROBIC Blood Culture adequate volume Performed at [facility], [address] [place], [place] [address]    Culture   Final    NO GROWTH 5 DAYS Performed at [facility], [address], [place], [place] [address]    Report Status 05/12/2020 FINAL  Final    Radiology Reports DG Chest 1 View  Result Date: 05/07/2020 CLINICAL DATA:  Sacral wound.  No fever or cough. EXAM: CHEST  1 VIEW COMPARISON:  11/22/2019 FINDINGS: Cardiac silhouette is normal in size. No mediastinal or hilar masses or evidence of adenopathy. Clear lungs.  No pleural effusion or pneumothorax. Skeletal structures are demineralized but grossly intact. IMPRESSION: No active disease. Electronically Signed   By: Renard Hamper.D.  On: 05/07/2020 18:14   DG Pelvis 1-2 Views  Result Date: 05/07/2020 CLINICAL DATA:  Sacral wound.  Possible osteomyelitis. EXAM: PELVIS - 1-2 VIEW COMPARISON:  None. FINDINGS: Sacrum partly obscured by overlying bowel gas and stool. No fracture. No bone lesion. No visible bone resorption to suggest osteomyelitis. Hip joints, SI joints and symphysis pubis are normally spaced and aligned. Soft tissues are unremarkable. IMPRESSION: 1. No fracture, bone lesion or evidence of osteomyelitis. Mid to lower sacrum not well visualized. Electronically Signed   By: Amie Portland M.D.   On: 05/07/2020 18:15   CT HEAD WO CONTRAST  Result Date: 05/08/2020 CLINICAL DATA:  Mental status change, unknown cause. Additional provided: Patient admitted with wound  infection, dementia. EXAM: CT HEAD WITHOUT CONTRAST TECHNIQUE: Contiguous axial images were obtained from the base of the skull through the vertex without intravenous contrast. COMPARISON:  Prior noncontrast head CT examination 11/22/2019 and earlier. FINDINGS: Brain: Mild-to-moderate cerebral atrophy with a medial temporal lobe predominance. Advanced ill-defined hypoattenuation within the cerebral white matter is nonspecific, but compatible chronic small vessel ischemic disease. There is no acute intracranial hemorrhage. No demarcated cortical infarct. No extra-axial fluid collection. No evidence of intracranial mass. No midline shift. Vascular: No hyperdense vessel.  Atherosclerotic calcifications. Skull: Normal. Negative for fracture or focal lesion. Sinuses/Orbits: Visualized orbits show no acute finding. Minimal ethmoid sinus mucosal thickening. Other: Trace bilateral mastoid effusions. IMPRESSION: No evidence of acute intracranial abnormality. Mild/moderate cerebral atrophy with a medial temporal lobe predominance. Advanced chronic small vessel ischemic disease. Small bilateral mastoid effusions. Electronically Signed   By: Jackey Loge DO   On: 05/08/2020 16:47     Time Spent in minutes  30     Laverna Peace M.D on 05/15/2020 at 6:32 PM  To page go to [email or web address] - password [facility]

## 2020-05-16 ENCOUNTER — Inpatient Hospital Stay
Admit: 2020-05-16 | Discharge: 2020-06-10 | Disposition: A | Discharge status: Skilled Nursing Facility | Payer: Medicare Other | Attending: Internal Medicine | Admitting: Internal Medicine

## 2020-05-16 DIAGNOSIS — L0390 Cellulitis, unspecified: Secondary | ICD-10-CM | POA: Diagnosis present

## 2020-05-16 DIAGNOSIS — D72829 Elevated white blood cell count, unspecified: Secondary | ICD-10-CM

## 2020-05-16 MED ORDER — CEFDINIR 300 MG PO CAPS: 300.0000 mg | ORAL_CAPSULE | Freq: Two times a day (BID) | ORAL | Status: AC

## 2020-05-16 MED ORDER — PROSOURCE PLUS PO LIQD
30.0000 mL | Freq: Two times a day (BID) | ORAL | Status: DC
Start: 2020-05-17 — End: 2020-08-08

## 2020-05-16 MED ORDER — FUROSEMIDE 20 MG PO TABS: 20.0000 mg | ORAL_TABLET | ORAL | Status: DC | PRN

## 2020-05-16 MED ORDER — ZINC SULFATE 220 (50 ZN) MG PO CAPS
220.0000 mg | ORAL_CAPSULE | Freq: Every day | ORAL | Status: DC
Start: 2020-05-17 — End: 2020-08-08

## 2020-05-16 MED ORDER — COLLAGENASE 250 UNIT/GM EX OINT
TOPICAL_OINTMENT | Freq: Every day | CUTANEOUS | 0 refills | Status: DC
Start: 2020-05-17 — End: 2020-08-08

## 2020-05-16 MED ORDER — ASCORBIC ACID 500 MG PO TABS
500.0000 mg | ORAL_TABLET | Freq: Every day | ORAL | Status: DC
Start: 2020-05-17 — End: 2020-08-08

## 2020-05-16 MED ORDER — SERTRALINE HCL 50 MG PO TABS: 125.0000 mg | ORAL_TABLET | Freq: Every day | ORAL | Status: DC

## 2020-05-16 MED ORDER — ENSURE ENLIVE PO LIQD
237.0000 mL | Freq: Two times a day (BID) | ORAL | 12 refills | Status: DC
Start: 2020-05-16 — End: 2020-08-08

## 2020-05-16 MED ORDER — DOXYCYCLINE HYCLATE 100 MG PO TABS: 100.0000 mg | ORAL_TABLET | Freq: Two times a day (BID) | ORAL | Status: AC

## 2020-05-16 MED ORDER — POLYVINYL ALCOHOL 1.4 % OP SOLN
1.0000 [drp] | Freq: Two times a day (BID) | OPHTHALMIC | 0 refills | Status: DC
Start: 2020-05-16 — End: 2020-08-08

## 2020-05-16 MED ORDER — RISPERIDONE 0.5 MG PO TABS
0.7500 mg | ORAL_TABLET | Freq: Every day | ORAL | Status: DC
Start: 2020-05-16 — End: 2020-08-08

## 2020-05-16 MED ORDER — POLYVINYL ALCOHOL 1.4 % OP SOLN: 1.0000 [drp] | OPHTHALMIC | 0 refills | Status: DC | PRN

## 2020-05-16 MED ORDER — ADULT MULTIVITAMIN W/MINERALS CH
1.0000 | ORAL_TABLET | Freq: Every day | ORAL | Status: DC
Start: 2020-05-17 — End: 2020-08-08

## 2020-05-16 NOTE — Progress Notes (Signed)
  Speech Language Pathology Treatment: Dysphagia  Patient Details Name: Melinda Love MRN: [id number] DOB: 1934-02-13 Today's Date: 05/16/2020 Time:  -     Assessment / Plan / Recommendation Clinical Impression  Pt sleeping upon entrance to room.  Lunch tray at bedside, which SLP assisted pt to consume. Pt repositioned and assisted with self feeding. Initiall bolus of thin water followed by strong cough however no furhter episodes during intake of 8 ounces milk, 7 bites of pasta, 5 bites brocolli, 2 bites of meat loaf and 7 bites of magic cup.  Mastication appeared adequate with no oral retention.    Of note, pt benefits from encouragement/assist to eat as doubtful she would initiate eating without prompts thus full supervision continues to be recommended.  Suspect her swallow ability is near baseline and diet is advanced as far as recommended for institutionalized feeding in this SLP's opinion.    No SLP follow up at this time, but recommend oral care/dental brushing am and pm and monitoring for lingual whiteness/? oral candidiasis.  White coating does not appear to be due to thrush.    HPI HPI: Melinda Love is a 86 y.o. female with medical history significant for Alzheimers dementia and is unable to give history. Family reports dementia has greatly worsened over past two months and that she has become somnolent most of the time with intermittent verbalizations/alertness. Pt has been maintained on a puree/thin diet since her bedside swallow evaluation.      SLP Plan  All goals met       Recommendations  Diet recommendations: Dysphagia 3 (mechanical soft);Thin liquid Liquids provided via: Straw Medication Administration: Whole meds with puree (whole if small) Supervision: Staff to assist with self feeding Compensations: Small sips/bites;Slow rate (drink liquids t/o meals, brush teeth am and pm) Postural Changes and/or Swallow Maneuvers: Seated upright 90 degrees (side lying is  adequate)                Oral Care Recommendations: Oral care BID;Staff/trained caregiver to provide oral care Follow up Recommendations: None SLP Visit Diagnosis: Dysphagia, oral phase (R13.11) Plan: All goals met       GO                Melinda Love 05/16/2020, 12:37 PM  Melinda Lime, MS [facility] SLP Acute Rehab Services Office [phone] Pager [phone]

## 2020-05-16 NOTE — Consult Note (Signed)
WOC Nurse wound follow up Wound type:Stage 4 sacral wound.  Currently receiving hydrotherapy.  Wound has improved to less than 10% yellow slough. Frequency of hydrotherapy is decreased to 3x week and we anticipate discontinuing hydrotherapy after last session this week.   Measurement: 4 cm x 3.5 cm with visible sacral ligament in the wound bed.   Wound bed:10% slough  50% red and 40% anatomical structure (ligament) Drainage (amount, consistency, odor) moderate green effluent noted today.  MD made aware.  Will continue Santyl until end of week.  Periwound:intact Dressing procedure/placement/frequency: Continue wound care until end of week.  Will follow.  Maple Hudson MSN, RN, FNP-BC CWON Wound, Ostomy, Continence Nurse Pager [phone] :

## 2020-05-16 NOTE — Discharge Summary (Signed)
Melinda Love UEA:[id number] DOB: Jan 29, 1934 DOA: 05/07/2020  PCP: Patient, No Pcp Per  Admit date: 05/07/2020 Discharge date: 05/16/2020  Admitted From: Skilled nursing facility [facility]position: [facility]  Recommendations for Outpatient Follow-up:  1. Wound recommendations, continue Santyl, no longer requires hydrotherapy, close wound care at [facility] 2. Will need outpatient palliative care  3. On cefdinir and doxycycline while undergoing wound therapy, for at least 2 weeks  4.   Home Health:none Equipment/Devices:none  Discharge Condition:Stable  CODE STATUS:DNR    Brief/Interim Summary: History of present illness:  Ms. Melinda Love is a 86 year old female with medical history significant for Alzheimer's dementia, Sacral pressure injury being managed at her memory care unit at [facility], during evaluation at [facility] wound care center on 11/23 staged as stage IV with recommendations of wet-to-dry dressing and negative pressure wound therapy via wound VAC. Per daughter the wound recommendations were not being followed,  daughter brought her to ER due to concerns of it appearing worse.  During hospital course has remained afebrile hemodynamically stable, blood cultures unremarkable has slight white count of 13.3 on admission otherwise no signs or symptoms of sepsis UA was positive for nitrites and urine culture with E. coli (only resistant to ampicillin and Unasyn).  Superficial culture of sacral ulcer grew MRSA, E. coli, Proteus mirabilis.  Pelvic x-ray showed no evidence of osteomyelitis of mid to lower sacrum, chest x-ray unremarkable.  Patient was started on vancomycin and cefazolin before deescalation to cefdinir and doxycycline  Hospital Course:   Sacral pressure ulcer stage IV, present on admission.  Patient tolerating hydrotherapy by physical therapy (pulsatile lavage and suction) for continued debridement, again noted green exudate on 12/13 showing signs of partial granulation, fibrinous  exudate still present at base, PT believes she does not need daily hydrotherapy and ok with dressing changes,. I Spoke with [facility] nursing who has surgical debridement available there to assist with debridement and wound healing.  Pelvis x-ray on admission showed no evidence of osteomyelitis.  No changes in WBC or fever, --closely monitor to determine need to broaden out to pseudomonal coverage -Air mattress -No longer requires daily hydrotherapy per PT, will still benefit from a comprehensive care regarding her wound on discharge -continue following wound healing/supplementation recommended by dietary -Continue Santyl for enzymatic debridement per wound recommendations  Cellulitis of sacral ulcer stage IV ( present on admission), improving.  Osteomyelitis ruled out on admission with unremarkable imaging and inability to probe to bone.  Has had stable mild leukocytosis of 13--14.5 (on 12/11) throughout admission, otherwise has remained afebrile and no signs of sepsis.  Blood cultures are unremarkable.  Superficial wound cultures grew MRSA, E. coli, Proteus. Tolerated a week of hydrotherapy with improvement in granulation tissue and decreasing fibrinous exudate.  PT believes patient does not require daily hydrotherapy on discharge  --eevaluated by wound care team continue wound care with Santyl dressing changes -Currently tolerating cefdinir and p.o. doxycycline, should continue while monitoring wound healing --will need to continue wound therapy at LTAC  Acute metabolic encephalopathy, resolved and back to baseline. Likely driven from infection in setting of known dementia and further worsened by polypharmacy. Much improved today now alert to self and place. Following commands --continue antibiotics mentioned above --delirium precautions and monitor for sundowoning --Discontinued buspar, trazodone, and ativan during hospital stay to assist with encephalopathy  E.Coli UTI. Ua on  admission was concerning for infection. No current symptoms.  --continue cefdinir  Alzheimer's dementia with behavior disturbances, stable.  Alert to self and  place at baseline. Acute metabolic encephalopathy in the setting of infection due to confusion on admission now resolved. She tends to rub and pick at the area around her eye -Continue Zoloft, Risperdal, Aricept, Namenda --Buspar, trazodoneand ativan discontinued during hospital stay to help with behavioral disturbances and some of encephalopathy -Delirium precautions --Melatonin for sleep --Seen by palliative medicine team here and care discussed with daughter, agrees with DNR, palliative medicine as outpatient  B12 deficiency -Continue vitamin B12, multivitamin  Normocytic anemia. Iron panel seems consistent with anemia of chronic disease ( elevated ferritin, low iron). B12 and folate wnl. Hgb stable at 11. No current bleeding  Non ambulatory. Since August of this year has been wheelchair dependent . Required 2 person assist per PT evaluation.  -PT recommended SNF, disposition is ultimately LTAC to help with level of wound care which daughter is agreeable to.     Consultations:  Wound Care nursing  Procedures/Studies: none Subjective: Lying in bed with no complaints Discharge Exam: Vitals:   05/15/20 2116 05/16/20 0602  BP: (!) 155/75 (!) 146/78  Pulse: 83 78  Resp: 16 18  Temp: 98.8 F (37.1 C) 98.1 F (36.7 C)  SpO2: 94% 94%   Vitals:   05/15/20 0552 05/15/20 1401 05/15/20 2116 05/16/20 0602  BP: (!) 151/69 (!) 139/54 (!) 155/75 (!) 146/78  Pulse: 65 76 83 78  Resp: 16 20 16 18   Temp: 97.7 F (36.5 C) 98.3 F (36.8 C) 98.8 F (37.1 C) 98.1 F (36.7 C)  TempSrc: Oral  Oral Oral  SpO2: 94% 95% 94% 94%  Weight:      Height:        Awake, alert to self only today, in no acute distress, occasionally picking at skin Has some minimal redness in periorbital area bilaterally without drainage or  tenderness Normal respiratory effort on room air Regular rate and rhythm, no peripheral edema, no murmurs appreciated Abdomen soft, nondistended, nontender  From 05/15/20--Right gluteal area wound     Discharge Diagnoses:  Principal Problem:   Sacral decubitus ulcer, stage IV (HCC) Active Problems:   Alzheimer's dementia (HCC)   Leukocytosis   Hypokalemia   Anemia   E-coli UTI   Acute metabolic encephalopathy   Cellulitis    Discharge Instructions  Discharge Instructions    Diet - low sodium heart healthy   Complete by: As directed    Discharge wound care:   Complete by: As directed    Santyl wound care   Increase activity slowly   Complete by: As directed      Allergies as of 05/16/2020      Reactions   Sulfa Antibiotics Swelling      Medication List    STOP taking these medications   busPIRone 10 MG tablet Commonly known as: BUSPAR   guaifenesin 100 MG/5ML syrup Commonly known as: ROBITUSSIN   LORazepam 0.5 MG tablet Commonly known as: ATIVAN   traZODone 50 MG tablet Commonly known as: DESYREL     TAKE these medications   feeding supplement Liqd Take 237 mLs by mouth 2 (two) times daily between meals.   (feeding supplement) PROSource Plus liquid Take 30 mLs by mouth 2 (two) times daily between meals. Start taking on: May 17, 2020   acetaminophen 325 MG tablet Commonly known as: TYLENOL Take 650 mg by mouth every 6 (six) hours as needed for mild pain or fever.   ascorbic acid 500 MG tablet Commonly known as: VITAMIN C Take 1 tablet (500 mg total)  by mouth daily. Start taking on: May 17, 2020   aspirin EC 81 MG tablet Take 81 mg by mouth daily. Swallow whole.   carbamide peroxide 6.5 % OTIC solution Commonly known as: DEBROX Place 3 drops into both ears at bedtime.   cefdinir 300 MG capsule Commonly known as: OMNICEF Take 1 capsule (300 mg total) by mouth every 12 (twelve) hours for 14 days.   Cholecalciferol 125 MCG  (5000 UT) Tabs Take 5,000 Units by mouth daily.   collagenase ointment Commonly known as: SANTYL Apply topically daily. Start taking on: May 17, 2020   donepezil 10 MG tablet Commonly known as: ARICEPT Take 10 mg by mouth at bedtime.   doxycycline 100 MG tablet Commonly known as: VIBRA-TABS Take 1 tablet (100 mg total) by mouth every 12 (twelve) hours for 14 days.   eucerin cream Apply 1 application topically every evening. Apply to both feet   furosemide 20 MG tablet Commonly known as: LASIX Take 1 tablet (20 mg total) by mouth as needed for edema. What changed:   when to take this  reasons to take this   hydroxypropyl methylcellulose / hypromellose 2.5 % ophthalmic solution Commonly known as: ISOPTO TEARS / GONIOVISC Place 2 drops into both eyes.   melatonin 1 MG Tabs tablet Take 3 mg by mouth at bedtime.   memantine 10 MG tablet Commonly known as: NAMENDA Take 10 mg by mouth daily.   multivitamin with minerals Tabs tablet Take 1 tablet by mouth daily. Start taking on: May 17, 2020   polyvinyl alcohol 1.4 % ophthalmic solution Commonly known as: LIQUIFILM TEARS Place 1 drop into both eyes 2 (two) times daily.   polyvinyl alcohol 1.4 % ophthalmic solution Commonly known as: LIQUIFILM TEARS Place 1 drop into both eyes as needed for dry eyes.   risperiDONE 0.5 MG tablet Commonly known as: RISPERDAL Take 1.5 tablets (0.75 mg total) by mouth at bedtime. What changed: when to take this   sertraline 50 MG tablet Commonly known as: ZOLOFT Take 2.5 tablets (125 mg total) by mouth daily. What changed: medication strength   vitamin B-12 500 MCG tablet Commonly known as: CYANOCOBALAMIN Take 500 mcg by mouth daily.   zinc gluconate 50 MG tablet Take 50 mg by mouth daily.   zinc sulfate 220 (50 Zn) MG capsule Take 1 capsule (220 mg total) by mouth daily. Start taking on: May 17, 2020            Discharge Care Instructions  (From  admission, onward)         Start     Ordered   05/16/20 0000  Discharge wound care:       Comments: Santyl wound care   05/16/20 1452          Allergies  Allergen Reactions  . Sulfa Antibiotics Swelling        The results of significant diagnostics from this hospitalization (including imaging, microbiology, ancillary and laboratory) are listed below for reference.     Microbiology: Recent Results (from the past 240 hour(s))  Wound or Superficial Culture     Status: None   Collection Time: 05/07/20  3:22 PM   Specimen: Sacral; Wound  Result Value Ref Range Status   Specimen Description   Final    SACRAL Performed at [facility] [place], [address] [place], [place] [address]    Special Requests   Final    Normal Performed at [facility] [place], [id number] [place], [place] [address]    Gram Stain   Final    FEW WBC PRESENT, PREDOMINANTLY PMN MODERATE GRAM NEGATIVE RODS FEW GRAM POSITIVE COCCI IN PAIRS IN CLUSTERS RARE GRAM POSITIVE RODS Performed at [facility], [address] [place], [place] [address]    Culture   Final    FEW STAPHYLOCOCCUS AUREUS FEW PROTEUS MIRABILIS MODERATE ESCHERICHIA COLI    Report Status 05/11/2020 FINAL  Final   Organism ID, Bacteria STAPHYLOCOCCUS AUREUS  Final   Organism ID, Bacteria ESCHERICHIA COLI  Final   Organism ID, Bacteria PROTEUS MIRABILIS  Final      Susceptibility   Escherichia coli - MIC*    AMPICILLIN >=32 RESISTANT Resistant     CEFAZOLIN <=4 SENSITIVE Sensitive     CEFEPIME <=0.12 SENSITIVE Sensitive     CEFTAZIDIME <=1 SENSITIVE Sensitive     CEFTRIAXONE <=0.25 SENSITIVE Sensitive     CIPROFLOXACIN <=0.25 SENSITIVE Sensitive     GENTAMICIN <=1 SENSITIVE Sensitive     IMIPENEM <=0.25 SENSITIVE Sensitive     TRIMETH/SULFA <=20 SENSITIVE Sensitive     AMPICILLIN/SULBACTAM >=32 RESISTANT Resistant     PIP/TAZO <=4 SENSITIVE Sensitive     * MODERATE ESCHERICHIA COLI   Proteus  mirabilis - MIC*    AMPICILLIN >=32 RESISTANT Resistant     CEFAZOLIN 8 SENSITIVE Sensitive     CEFEPIME <=0.12 SENSITIVE Sensitive     CEFTAZIDIME <=1 SENSITIVE Sensitive     CEFTRIAXONE <=0.25 SENSITIVE Sensitive     CIPROFLOXACIN >=4 RESISTANT Resistant     GENTAMICIN <=1 SENSITIVE Sensitive     IMIPENEM 8 INTERMEDIATE Intermediate     TRIMETH/SULFA >=320 RESISTANT Resistant     AMPICILLIN/SULBACTAM <=2 SENSITIVE Sensitive     PIP/TAZO <=4 SENSITIVE Sensitive     * FEW PROTEUS MIRABILIS   Staphylococcus aureus - MIC*    CIPROFLOXACIN >=8 RESISTANT Resistant     ERYTHROMYCIN >=8 RESISTANT Resistant     GENTAMICIN <=0.5 SENSITIVE Sensitive     OXACILLIN >=4 RESISTANT Resistant     TETRACYCLINE <=1 SENSITIVE Sensitive     VANCOMYCIN <=0.5 SENSITIVE Sensitive     TRIMETH/SULFA <=10 SENSITIVE Sensitive     CLINDAMYCIN >=8 RESISTANT Resistant     RIFAMPIN <=0.5 SENSITIVE Sensitive     Inducible Clindamycin NEGATIVE Sensitive     * FEW STAPHYLOCOCCUS AUREUS  Urine culture     Status: Abnormal   Collection Time: 05/07/20  4:07 PM   Specimen: Urine, Random  Result Value Ref Range Status   Specimen Description   Final    URINE, RANDOM Performed at [facility], [address] [place], [place] [address]    Special Requests   Final    Normal Performed at [facility], [address] [place], [place] [address]    Culture >=100,000 COLONIES/mL ESCHERICHIA COLI (A)  Final   Report Status 05/10/2020 FINAL  Final   Organism ID, Bacteria ESCHERICHIA COLI (A)  Final      Susceptibility   Escherichia coli - MIC*    AMPICILLIN >=32 RESISTANT Resistant     CEFAZOLIN <=4 SENSITIVE Sensitive     CEFEPIME <=0.12 SENSITIVE Sensitive     CEFTRIAXONE <=0.25 SENSITIVE Sensitive     CIPROFLOXACIN <=0.25 SENSITIVE Sensitive     GENTAMICIN <=1 SENSITIVE Sensitive     IMIPENEM <=0.25 SENSITIVE Sensitive     NITROFURANTOIN <=16 SENSITIVE Sensitive     TRIMETH/SULFA <=20  SENSITIVE  Sensitive     AMPICILLIN/SULBACTAM >=32 RESISTANT Resistant     PIP/TAZO <=4 SENSITIVE Sensitive     * >=100,000 COLONIES/mL ESCHERICHIA COLI  Resp Panel by RT-PCR (Flu A&B, Covid) Nasopharyngeal Swab     Status: None   Collection Time: 05/07/20  5:07 PM   Specimen: Nasopharyngeal Swab; Nasopharyngeal(NP) swabs in vial transport medium  Result Value Ref Range Status   SARS Coronavirus 2 by RT PCR NEGATIVE NEGATIVE Final    Comment: (NOTE) SARS-CoV-2 target nucleic acids are NOT DETECTED.  The SARS-CoV-2 RNA is generally detectable in upper respiratory specimens during the acute phase of infection. The lowest concentration of SARS-CoV-2 viral copies this assay can detect is 138 copies/mL. A negative result does not preclude SARS-Cov-2 infection and should not be used as the sole basis for treatment or other patient management decisions. A negative result may occur with  improper specimen collection/handling, submission of specimen other than nasopharyngeal swab, presence of viral mutation(s) within the areas targeted by this assay, and inadequate number of viral copies(<138 copies/mL). A negative result must be combined with clinical observations, patient history, and epidemiological information. The expected result is Negative.  Fact Sheet for Patients:  BloggerCourse.com  Fact Sheet for Healthcare Providers:  SeriousBroker.it  This test is no t yet approved or cleared by the [place] FDA and  has been authorized for detection and/or diagnosis of SARS-CoV-2 by FDA under an Emergency Use Authorization (EUA). This EUA will remain  in effect (meaning this test can be used) for the duration of the COVID-19 declaration under Section 564(b)(1) of the Act, 21 U.S.C.section 360bbb-3(b)(1), unless the authorization is terminated  or revoked sooner.       Influenza A by PCR NEGATIVE NEGATIVE Final   Influenza B by PCR  NEGATIVE NEGATIVE Final    Comment: (NOTE) The Xpert Xpress SARS-CoV-2/FLU/RSV plus assay is intended as an aid in the diagnosis of influenza from Nasopharyngeal swab specimens and should not be used as a sole basis for treatment. Nasal washings and aspirates are unacceptable for Xpert Xpress SARS-CoV-2/FLU/RSV testing.  Fact Sheet for Patients: BloggerCourse.com  Fact Sheet for Healthcare Providers: SeriousBroker.it  This test is not yet approved or cleared by the [place] FDA and has been authorized for detection and/or diagnosis of SARS-CoV-2 by FDA under an Emergency Use Authorization (EUA). This EUA will remain in effect (meaning this test can be used) for the duration of the COVID-19 declaration under Section 564(b)(1) of the Act, 21 U.S.C. section 360bbb-3(b)(1), unless the authorization is terminated or revoked.  Performed at [facility], [address] [place], [place] [address]   Culture, blood (single)     Status: None   Collection Time: 05/07/20  5:15 PM   Specimen: BLOOD LEFT WRIST  Result Value Ref Range Status   Specimen Description   Final    BLOOD LEFT WRIST Performed at [facility], [address] [place], [place] [address]    Special Requests   Final    BOTTLES DRAWN AEROBIC AND ANAEROBIC Blood Culture adequate volume Performed at [facility], [address] [place], [place] [address]    Culture   Final    NO GROWTH 5 DAYS Performed at [facility], [address], [place], [place] [address]    Report Status 05/12/2020 FINAL  Final     Labs: BNP (last 3 results) No results for input(s): BNP in the last 8760 hours. Basic  Metabolic Panel: Recent Labs  Lab 05/10/20 0549 05/10/20 0853 05/11/20 0658 05/15/20 0517  NA  --  141  --   --   K  --  3.5  --   --   CL  --  101  --   --   CO2  --  28  --   --   GLUCOSE  --  96  --   --   BUN  --  10  --    --   CREATININE 0.77 0.80 0.81 0.83  CALCIUM  --  8.8*  --   --    Liver Function Tests: No results for input(s): AST, ALT, ALKPHOS, BILITOT, PROT, ALBUMIN in the last 168 hours. No results for input(s): LIPASE, AMYLASE in the last 168 hours. No results for input(s): AMMONIA in the last 168 hours. CBC: Recent Labs  Lab 05/12/20 0505 05/14/20 0600  WBC 12.2* 14.5*  HGB 11.4* 10.9*  HCT 36.2 35.2*  MCV 86.6 87.3  PLT 315 318   Cardiac Enzymes: No results for input(s): CKTOTAL, CKMB, CKMBINDEX, TROPONINI in the last 168 hours. BNP: Invalid input(s): POCBNP CBG: No results for input(s): GLUCAP in the last 168 hours. D-Dimer No results for input(s): DDIMER in the last 72 hours. Hgb A1c No results for input(s): HGBA1C in the last 72 hours. Lipid Profile No results for input(s): CHOL, HDL, LDLCALC, TRIG, CHOLHDL, LDLDIRECT in the last 72 hours. Thyroid function studies No results for input(s): TSH, T4TOTAL, T3FREE, THYROIDAB in the last 72 hours.  Invalid input(s): FREET3 Anemia work up No results for input(s): VITAMINB12, FOLATE, FERRITIN, TIBC, IRON, RETICCTPCT in the last 72 hours. Urinalysis    Component Value Date/Time   COLORURINE YELLOW 05/07/2020 1607   APPEARANCEUR CLOUDY (A) 05/07/2020 1607   LABSPEC 1.010 05/07/2020 1607   PHURINE 7.0 05/07/2020 1607   GLUCOSEU NEGATIVE 05/07/2020 1607   HGBUR NEGATIVE 05/07/2020 1607   BILIRUBINUR NEGATIVE 05/07/2020 1607   KETONESUR NEGATIVE 05/07/2020 1607   PROTEINUR NEGATIVE 05/07/2020 1607   NITRITE POSITIVE (A) 05/07/2020 1607   LEUKOCYTESUR SMALL (A) 05/07/2020 1607   Sepsis Labs Invalid input(s): PROCALCITONIN,  WBC,  LACTICIDVEN Microbiology Recent Results (from the past 240 hour(s))  Wound or Superficial Culture     Status: None   Collection Time: 05/07/20  3:22 PM   Specimen: Sacral; Wound  Result Value Ref Range Status   Specimen Description   Final    SACRAL Performed at [facility], [address] [place], [place] [address]    Special Requests   Final    Normal Performed at [facility], [address] [place], [place] [address]    Gram Stain   Final    FEW WBC PRESENT, PREDOMINANTLY PMN MODERATE GRAM NEGATIVE RODS FEW GRAM POSITIVE COCCI IN PAIRS IN CLUSTERS RARE GRAM POSITIVE RODS Performed at [facility], [address], [place], [place] [address]    Culture   Final    FEW STAPHYLOCOCCUS AUREUS FEW PROTEUS MIRABILIS MODERATE ESCHERICHIA COLI    Report Status 05/11/2020 FINAL  Final   Organism ID, Bacteria STAPHYLOCOCCUS AUREUS  Final   Organism ID, Bacteria ESCHERICHIA COLI  Final   Organism ID, Bacteria PROTEUS MIRABILIS  Final      Susceptibility   Escherichia coli - MIC*    AMPICILLIN >=32 RESISTANT Resistant     CEFAZOLIN <=4 SENSITIVE Sensitive     CEFEPIME <=0.12 SENSITIVE Sensitive  CEFTAZIDIME <=1 SENSITIVE Sensitive     CEFTRIAXONE <=0.25 SENSITIVE Sensitive     CIPROFLOXACIN <=0.25 SENSITIVE Sensitive     GENTAMICIN <=1 SENSITIVE Sensitive     IMIPENEM <=0.25 SENSITIVE Sensitive     TRIMETH/SULFA <=20 SENSITIVE Sensitive     AMPICILLIN/SULBACTAM >=32 RESISTANT Resistant     PIP/TAZO <=4 SENSITIVE Sensitive     * MODERATE ESCHERICHIA COLI   Proteus mirabilis - MIC*    AMPICILLIN >=32 RESISTANT Resistant     CEFAZOLIN 8 SENSITIVE Sensitive     CEFEPIME <=0.12 SENSITIVE Sensitive     CEFTAZIDIME <=1 SENSITIVE Sensitive     CEFTRIAXONE <=0.25 SENSITIVE Sensitive     CIPROFLOXACIN >=4 RESISTANT Resistant     GENTAMICIN <=1 SENSITIVE Sensitive     IMIPENEM 8 INTERMEDIATE Intermediate     TRIMETH/SULFA >=320 RESISTANT Resistant     AMPICILLIN/SULBACTAM <=2 SENSITIVE Sensitive     PIP/TAZO <=4 SENSITIVE Sensitive     * FEW PROTEUS MIRABILIS   Staphylococcus aureus - MIC*    CIPROFLOXACIN >=8 RESISTANT Resistant     ERYTHROMYCIN >=8 RESISTANT Resistant     GENTAMICIN <=0.5 SENSITIVE Sensitive     OXACILLIN >=4  RESISTANT Resistant     TETRACYCLINE <=1 SENSITIVE Sensitive     VANCOMYCIN <=0.5 SENSITIVE Sensitive     TRIMETH/SULFA <=10 SENSITIVE Sensitive     CLINDAMYCIN >=8 RESISTANT Resistant     RIFAMPIN <=0.5 SENSITIVE Sensitive     Inducible Clindamycin NEGATIVE Sensitive     * FEW STAPHYLOCOCCUS AUREUS  Urine culture     Status: Abnormal   Collection Time: 05/07/20  4:07 PM   Specimen: Urine, Random  Result Value Ref Range Status   Specimen Description   Final    URINE, RANDOM Performed at [facility], [address] [place], [place] [address]    Special Requests   Final    Normal Performed at [facility] [place], [address] [place], [place] [address]    Culture >=100,000 COLONIES/mL ESCHERICHIA COLI (A)  Final   Report Status 05/10/2020 FINAL  Final   Organism ID, Bacteria ESCHERICHIA COLI (A)  Final      Susceptibility   Escherichia coli - MIC*    AMPICILLIN >=32 RESISTANT Resistant     CEFAZOLIN <=4 SENSITIVE Sensitive     CEFEPIME <=0.12 SENSITIVE Sensitive     CEFTRIAXONE <=0.25 SENSITIVE Sensitive     CIPROFLOXACIN <=0.25 SENSITIVE Sensitive     GENTAMICIN <=1 SENSITIVE Sensitive     IMIPENEM <=0.25 SENSITIVE Sensitive     NITROFURANTOIN <=16 SENSITIVE Sensitive     TRIMETH/SULFA <=20 SENSITIVE Sensitive     AMPICILLIN/SULBACTAM >=32 RESISTANT Resistant     PIP/TAZO <=4 SENSITIVE Sensitive     * >=100,000 COLONIES/mL ESCHERICHIA COLI  Resp Panel by RT-PCR (Flu A&B, Covid) Nasopharyngeal Swab     Status: None   Collection Time: 05/07/20  5:07 PM   Specimen: Nasopharyngeal Swab; Nasopharyngeal(NP) swabs in vial transport medium  Result Value Ref Range Status   SARS Coronavirus 2 by RT PCR NEGATIVE NEGATIVE Final    Comment: (NOTE) SARS-CoV-2 target nucleic acids are NOT DETECTED.  The SARS-CoV-2 RNA is generally detectable in upper respiratory specimens during the acute phase of infection. The lowest concentration of SARS-CoV-2 viral copies this  assay can detect is 138 copies/mL. A negative result does not preclude SARS-Cov-2 infection and should not be used as the sole basis for treatment or other patient management decisions.  A negative result may occur with  improper specimen collection/handling, submission of specimen other than nasopharyngeal swab, presence of viral mutation(s) within the areas targeted by this assay, and inadequate number of viral copies(<138 copies/mL). A negative result must be combined with clinical observations, patient history, and epidemiological information. The expected result is Negative.  Fact Sheet for Patients:  BloggerCourse.com  Fact Sheet for Healthcare Providers:  SeriousBroker.it  This test is no t yet approved or cleared by the [place] FDA and  has been authorized for detection and/or diagnosis of SARS-CoV-2 by FDA under an Emergency Use Authorization (EUA). This EUA will remain  in effect (meaning this test can be used) for the duration of the COVID-19 declaration under Section 564(b)(1) of the Act, 21 U.S.C.section 360bbb-3(b)(1), unless the authorization is terminated  or revoked sooner.       Influenza A by PCR NEGATIVE NEGATIVE Final   Influenza B by PCR NEGATIVE NEGATIVE Final    Comment: (NOTE) The Xpert Xpress SARS-CoV-2/FLU/RSV plus assay is intended as an aid in the diagnosis of influenza from Nasopharyngeal swab specimens and should not be used as a sole basis for treatment. Nasal washings and aspirates are unacceptable for Xpert Xpress SARS-CoV-2/FLU/RSV testing.  Fact Sheet for Patients: BloggerCourse.com  Fact Sheet for Healthcare Providers: SeriousBroker.it  This test is not yet approved or cleared by the [place] FDA and has been authorized for detection and/or diagnosis of SARS-CoV-2 by FDA under an Emergency Use Authorization (EUA). This EUA will  remain in effect (meaning this test can be used) for the duration of the COVID-19 declaration under Section 564(b)(1) of the Act, 21 U.S.C. section 360bbb-3(b)(1), unless the authorization is terminated or revoked.  Performed at [facility], [address] [place], [place] [address]   Culture, blood (single)     Status: None   Collection Time: 05/07/20  5:15 PM   Specimen: BLOOD LEFT WRIST  Result Value Ref Range Status   Specimen Description   Final    BLOOD LEFT WRIST Performed at [facility], [address] [place], [place] [address]    Special Requests   Final    BOTTLES DRAWN AEROBIC AND ANAEROBIC Blood Culture adequate volume Performed at [facility], [address] [place], [place] [address]    Culture   Final    NO GROWTH 5 DAYS Performed at [facility], [address], [place], [place] [address]    Report Status 05/12/2020 FINAL  Final     Time coordinating discharge: Over 30 minutes  SIGNED:   Laverna Peace, MD  [facility] 05/16/2020, 2:52 PM Pager   If 7PM-7AM, please contact night-coverage [email or web address] Password [email or web address]

## 2020-05-16 NOTE — Progress Notes (Signed)
Physical Therapy Treatment Patient Details Name: Melinda Love MRN: [id number] DOB: 09-07-1933 Today's Date: 05/16/2020    History of Present Illness : Melinda Love is a 86 y.o. female with medical history significant for Alzheimers dementia and is unable to give history. History obtained from her daughter, Rock Nephew. She has been at [facility] unit since August and not ambulatory since this time. The facility noticed a wound on her sacrum on 11/19 and was sent to the [facility] wound care center on 11/23    PT Comments    Pt assisted to sitting EOB however pt reluctant to perform further mobility.  Pt reported being cold and attempting to return to bed.  Pt BM on bed pad upon sitting so returned to supine to assist with pericare and perform hydrotherapy.  Continue to recommend SNF upon d/c.   Follow Up Recommendations  SNF     Equipment Recommendations  None recommended by PT    Recommendations for Other Services       Precautions / Restrictions Precautions Precautions: Fall Precaution Comments: right gluteal/sacral wound Stage IV, Hx advanced Dementia    Mobility  Bed Mobility Overal bed mobility: Needs Assistance Bed Mobility: Rolling;Sidelying to Sit;Sit to Sidelying Rolling: Total assist;+2 for physical assistance Sidelying to sit: Max assist;+2 for safety/equipment     Sit to sidelying: Max assist;+2 for safety/equipment General bed mobility comments: pt requiring assist for upper and lower body, pt only sitting for approx 2-3 minutes and requesting return to bed, pt assisting more with LEs upon return to bed  Transfers                 General transfer comment: pt declined, attempting to return to bed  Ambulation/Gait                 Stairs             Wheelchair Mobility    Modified Rankin (Stroke Patients Only)       Balance Overall balance assessment: Needs assistance Sitting-balance support: Feet supported;Bilateral upper extremity  supported Sitting balance-Leahy Scale: Zero Sitting balance - Comments: pt assisting initially however with posterior lean upon wanting to return to supine and not correcting with cues/assist Postural control: Posterior lean                                  Cognition Arousal/Alertness: Awake/alert Behavior During Therapy: Flat affect Overall Cognitive Status: History of cognitive impairments - at baseline                                        Exercises      General Comments        Pertinent Vitals/Pain Pain Assessment: Faces Faces Pain Scale: No hurt Pain Intervention(s): Repositioned;Monitored during session    Home Living                      Prior Function            PT Goals (current goals can now be found in the care plan section) Progress towards PT goals: Progressing toward goals    Frequency    Min 2X/week      PT Plan Current plan remains appropriate    Co-evaluation  AM-PAC PT "6 Clicks" Mobility   Outcome Measure  Help needed turning from your back to your side while in a flat bed without using bedrails?: Total Help needed moving from lying on your back to sitting on the side of a flat bed without using bedrails?: Total Help needed moving to and from a bed to a chair (including a wheelchair)?: Total Help needed standing up from a chair using your arms (e.g., wheelchair or bedside chair)?: Total Help needed to walk in hospital room?: Total Help needed climbing 3-5 steps with a railing? : Total 6 Click Score: 6    End of Session Equipment Utilized During Treatment: Gait belt Activity Tolerance: Patient tolerated treatment well Patient left: in bed;with call bell/phone within reach;with bed alarm set   PT Visit Diagnosis: Muscle weakness (generalized) (M62.81)     Time: 7672-0947 PT Time Calculation (min) (ACUTE ONLY): 8 min  Charges:  $Therapeutic Activity: 8-22 mins                      Paulino Door, DPT Acute Rehabilitation Services Pager: [phone] Office: [phone]  Esteven Overfelt,KATHrine E 05/16/2020, 11:04 AM

## 2020-05-16 NOTE — Progress Notes (Signed)
RN called [facility] and gave telephone report to Johney Frame [facility] RN. All questions addressed. No further needs.

## 2020-05-16 NOTE — Progress Notes (Signed)
[facility]  PROGRESS NOTE  Melinda Love RDE:[id number] DOB: 03-Jul-1933 DOA: 05/07/2020 PCP: Patient, No Pcp Per Admit date - 05/07/2020   Admitting Physician Orland Mustard, MD  Outpatient Primary MD for the patient is Patient, No Pcp Per  LOS - 9 Brief Narrative  Melinda Love is a 86 year old female with medical history significant for Alzheimer's dementia, Sacral pressure injury being managed at her memory care unit at [facility], during evaluation at [facility] wound care center on 11/23 staged as stage IV with recommendations of wet-to-dry dressing and negative pressure wound therapy via wound VAC. Per daughter the wound recommendations were not being followed,  daughter brought her to ER due to concerns of it appearing worse.  During hospital course has remained afebrile hemodynamically stable, blood cultures unremarkable has slight white count of 13.3 on admission otherwise no signs or symptoms of sepsis UA was positive for nitrites and urine culture with E. coli (only resistant to ampicillin and Unasyn).  Superficial culture of sacral ulcer grew MRSA, E. coli, Proteus mirabilis.  Pelvic x-ray showed no evidence of osteomyelitis of mid to lower sacrum, chest x-ray unremarkable.  Patient was started on vancomycin and cefazolin before deescalation to cefdinir and doxycycline  Subjective  No complaints today. No acute events overnight. Lying in bed comfortably  A & P  Sacral pressure ulcer stage IV, present on admission.  Patient tolerating hydrotherapy by physical therapy (pulsatile lavage and suction) for continued debridement, again noted green exudate on 12/13 showing signs of partial granulation, fibrinous exudate still present at base, PT believes she does not need daily hydrotherapy and ok with dressing changes,. I Spoke with [facility] nursing who has surgical debridement available there to assist with debridement and wound healing.  Pelvis x-ray on admission showed no evidence  of osteomyelitis.  No changes in WBC or fever, --closely monitor to determine need to broaden out to pseudomonal coverage -Air mattress -No longer requires daily hydrotherapy per PT, will still benefit from a comprehensive care regarding her wound on discharge -continue following wound healing/supplementation recommended by dietary -Continue Santyl for enzymatic debridement per wound recommendations  Cellulitis of sacral ulcer, improving.  Osteomyelitis ruled out on admission.  With imaging and inability to probe to bone.  Had mild leukocytosis on admission and white count stable at 14.5, otherwise has remained afebrile and no signs of sepsis.  Blood cultures are unremarkable.  Wound cultures grew MRSA, E. coli, Proteus -Currently tolerating cefdinir and p.o. doxycycline --low threshold to increase back to IV if febrile, or new leukocytosis, in particular pseudomonal coverage given more green in color per PT  Acute metabolic encephalopathy, greatly improved back to baseline. Likely being most driven from infection in setting of known dementia. Much improved today now alert to self and place. Following commands --continue antibiotics mentioned above --delirium precautions  E.Coli UTI. No current symptoms.  --continue cefdinir  Alzheimer's dementia with behavior disturbances, stable.  Alert to self and place this a.m. which is improved given concern for acute metabolic encephalopathy in the setting of infection due to confusion on admission -Continue Zoloft, Risperdal, Aricept, Namenda -Delirium precautions  B12 deficiency -Continue vitamin B12, multivitamin  Normocytic anemia. Iron panel seems consistent with anemia of chronic disease ( elevated ferritin, low iron). B12 and folate wnl. Hgb stable at 11. No current bleeding  Non ambulatory. Since August of this year has been wheelchair dependent . Required 2 person assist per PT evaluation.  -PT recommends SNF, TOC arranging for LTAC to  help with level of wound care, will discuss with daughter      Family Communication  : Spoke with daughter [name], [name] on phone 12/13  Code Status : DNR, discussed on day of admission  Disposition Plan  :  Patient is from memory care unit. Anticipated d/c date: 1 to 2 days. Barriers to d/c or necessity for inpatient status: Medically stable for discharge,  No longer needs daily PT hydrotherapy, TOC arranging LTAC given continued needs for close monitoring of wound and ensure continued healing.  Consults  : Wound care  Procedures  : None  DVT Prophylaxis  :  Lovenox  MDM: The below labs and imaging reports were reviewed and summarized above.  Medication management as above.  Lab Results  Component Value Date   PLT 318 05/14/2020    Diet :  Diet Order            DIET DYS 3 Room service appropriate? Yes; Fluid consistency: Thin  Diet effective now                  Inpatient Medications Scheduled Meds: . (feeding supplement) PROSource Plus  30 mL Oral BID BM  . vitamin C  500 mg Oral Daily  . cefdinir  300 mg Oral Q12H  . cholecalciferol  5,000 Units Oral Daily  . collagenase   Topical Daily  . donepezil  10 mg Oral QHS  . doxycycline  100 mg Oral Q12H  . enoxaparin (LOVENOX) injection  40 mg Subcutaneous Daily  . feeding supplement  237 mL Oral BID BM  . melatonin  3 mg Oral QHS  . memantine  10 mg Oral Daily  . multivitamin with minerals  1 tablet Oral Daily  . polyvinyl alcohol  1 drop Both Eyes BID  . risperiDONE  0.75 mg Oral QHS  . sertraline  125 mg Oral Daily  . vitamin B-12  500 mcg Oral Daily  . zinc sulfate  220 mg Oral Daily   Continuous Infusions: . sodium chloride Stopped (05/10/20 2314)   PRN Meds:.sodium chloride, acetaminophen, antiseptic oral rinse, LORazepam, polyvinyl alcohol  Antibiotics  :   Anti-infectives (From admission, onward)   Start     Dose/Rate Route Frequency Ordered Stop   05/10/20 2200  cefdinir (OMNICEF) capsule 300 mg         300 mg Oral Every 12 hours 05/10/20 1906     05/10/20 2200  doxycycline (VIBRA-TABS) tablet 100 mg        100 mg Oral Every 12 hours 05/10/20 1906     05/08/20 1200  vancomycin (VANCOREADY) IVPB 1250 mg/250 mL  Status:  Discontinued        1,250 mg 166.7 mL/hr over 90 Minutes Intravenous Every 24 hours 05/08/20 0143 05/10/20 1905   05/08/20 0200  piperacillin-tazobactam (ZOSYN) IVPB 3.375 g  Status:  Discontinued        3.375 g 12.5 mL/hr over 240 Minutes Intravenous Every 8 hours 05/08/20 0143 05/10/20 1905   05/07/20 1815  ceFAZolin (ANCEF) IVPB 1 g/50 mL premix        1 g 100 mL/hr over 30 Minutes Intravenous  Once 05/07/20 1812 05/07/20 1900   05/07/20 1815  vancomycin (VANCOCIN) IVPB 1000 mg/200 mL premix        1,000 mg 200 mL/hr over 60 Minutes Intravenous  Once 05/07/20 1814 05/07/20 2037       Objective   Vitals:   05/15/20 0552 05/15/20 1401 05/15/20 2116 05/16/20 0602  BP: (!) 151/69 (!) 139/54 (!) 155/75 (!) 146/78  Pulse: 65 76 83 78  Resp: Temp: 97.7 F (36.5 C) 98.3 F (36.8 C) 98.8 F (37.1 C) 98.1 F (36.7 C)  TempSrc: Oral  Oral Oral  SpO2: 94% 95% 94% 94%  Weight:      Height:        SpO2: 94 %  Wt Readings from Last 3 Encounters:  05/14/20 62.6 kg  11/22/19 75.7 kg  10/07/17 63 kg     Intake/Output Summary (Last 24 hours) at 05/16/2020 1304 Last data filed at 05/16/2020 0840 Gross per 24 hour  Intake 660 ml  Output 900 ml  Net -240 ml    Physical Exam:  Awake, alert to self only today, in no acute distress Normal respiratory effort on room air Regular rate and rhythm, no peripheral edema, no murmurs appreciated Abdomen soft, nondistended, nontender From 05/15/20--Right gluteal area wound         I have personally reviewed the following:   Data Reviewed:  CBC Recent Labs  Lab 05/12/20 0505 05/14/20 0600  WBC 12.2* 14.5*  HGB 11.4* 10.9*  HCT 36.2 35.2*  PLT 315 318  MCV 86.6 87.3  MCH 27.3 27.0   MCHC 31.5 31.0  RDW 13.8 14.0    Chemistries  Recent Labs  Lab 05/10/20 0549 05/10/20 0853 05/11/20 0658 05/15/20 0517  NA  --  141  --   --   K  --  3.5  --   --   CL  --  101  --   --   CO2  --  28  --   --   GLUCOSE  --  96  --   --   BUN  --  10  --   --   CREATININE 0.77 0.80 0.81 0.83  CALCIUM  --  8.8*  --   --    ------------------------------------------------------------------------------------------------------------------ No results for input(s): CHOL, HDL, LDLCALC, TRIG, CHOLHDL, LDLDIRECT in the last 72 hours.  No results found for: HGBA1C ------------------------------------------------------------------------------------------------------------------ No results for input(s): TSH, T4TOTAL, T3FREE, THYROIDAB in the last 72 hours.  Invalid input(s): FREET3 ------------------------------------------------------------------------------------------------------------------ No results for input(s): VITAMINB12, FOLATE, FERRITIN, TIBC, IRON, RETICCTPCT in the last 72 hours.  Coagulation profile No results for input(s): INR, PROTIME in the last 168 hours.  No results for input(s): DDIMER in the last 72 hours.  Cardiac Enzymes No results for input(s): CKMB, TROPONINI, MYOGLOBIN in the last 168 hours.  Invalid input(s): CK ------------------------------------------------------------------------------------------------------------------ No results found for: BNP  Micro Results Recent Results (from the past 240 hour(s))  Wound or Superficial Culture     Status: None   Collection Time: 05/07/20  3:22 PM   Specimen: Sacral; Wound  Result Value Ref Range Status   Specimen Description   Final    SACRAL Performed at [facility], [address] [place], [place] [address]    Special Requests   Final    Normal Performed at [facility], [address] [place], [place] [address]    Gram Stain   Final    FEW WBC PRESENT, PREDOMINANTLY  PMN MODERATE GRAM NEGATIVE RODS FEW GRAM POSITIVE COCCI IN PAIRS IN CLUSTERS RARE GRAM POSITIVE RODS Performed at [facility], [address], [place], [place] [address]    Culture   Final    FEW STAPHYLOCOCCUS AUREUS FEW PROTEUS MIRABILIS MODERATE ESCHERICHIA COLI    Report Status  05/11/2020 FINAL  Final   Organism ID, Bacteria STAPHYLOCOCCUS AUREUS  Final   Organism ID, Bacteria ESCHERICHIA COLI  Final   Organism ID, Bacteria PROTEUS MIRABILIS  Final      Susceptibility   Escherichia coli - MIC*    AMPICILLIN >=32 RESISTANT Resistant     CEFAZOLIN <=4 SENSITIVE Sensitive     CEFEPIME <=0.12 SENSITIVE Sensitive     CEFTAZIDIME <=1 SENSITIVE Sensitive     CEFTRIAXONE <=0.25 SENSITIVE Sensitive     CIPROFLOXACIN <=0.25 SENSITIVE Sensitive     GENTAMICIN <=1 SENSITIVE Sensitive     IMIPENEM <=0.25 SENSITIVE Sensitive     TRIMETH/SULFA <=20 SENSITIVE Sensitive     AMPICILLIN/SULBACTAM >=32 RESISTANT Resistant     PIP/TAZO <=4 SENSITIVE Sensitive     * MODERATE ESCHERICHIA COLI   Proteus mirabilis - MIC*    AMPICILLIN >=32 RESISTANT Resistant     CEFAZOLIN 8 SENSITIVE Sensitive     CEFEPIME <=0.12 SENSITIVE Sensitive     CEFTAZIDIME <=1 SENSITIVE Sensitive     CEFTRIAXONE <=0.25 SENSITIVE Sensitive     CIPROFLOXACIN >=4 RESISTANT Resistant     GENTAMICIN <=1 SENSITIVE Sensitive     IMIPENEM 8 INTERMEDIATE Intermediate     TRIMETH/SULFA >=320 RESISTANT Resistant     AMPICILLIN/SULBACTAM <=2 SENSITIVE Sensitive     PIP/TAZO <=4 SENSITIVE Sensitive     * FEW PROTEUS MIRABILIS   Staphylococcus aureus - MIC*    CIPROFLOXACIN >=8 RESISTANT Resistant     ERYTHROMYCIN >=8 RESISTANT Resistant     GENTAMICIN <=0.5 SENSITIVE Sensitive     OXACILLIN >=4 RESISTANT Resistant     TETRACYCLINE <=1 SENSITIVE Sensitive     VANCOMYCIN <=0.5 SENSITIVE Sensitive     TRIMETH/SULFA <=10 SENSITIVE Sensitive     CLINDAMYCIN >=8 RESISTANT Resistant     RIFAMPIN <=0.5 SENSITIVE Sensitive      Inducible Clindamycin NEGATIVE Sensitive     * FEW STAPHYLOCOCCUS AUREUS  Urine culture     Status: Abnormal   Collection Time: 05/07/20  4:07 PM   Specimen: Urine, Random  Result Value Ref Range Status   Specimen Description   Final    URINE, RANDOM Performed at [facility], [address] [place], [place] [address]    Special Requests   Final    Normal Performed at [facility], [address] [place], [place] [address]    Culture >=100,000 COLONIES/mL ESCHERICHIA COLI (A)  Final   Report Status 05/10/2020 FINAL  Final   Organism ID, Bacteria ESCHERICHIA COLI (A)  Final      Susceptibility   Escherichia coli - MIC*    AMPICILLIN >=32 RESISTANT Resistant     CEFAZOLIN <=4 SENSITIVE Sensitive     CEFEPIME <=0.12 SENSITIVE Sensitive     CEFTRIAXONE <=0.25 SENSITIVE Sensitive     CIPROFLOXACIN <=0.25 SENSITIVE Sensitive     GENTAMICIN <=1 SENSITIVE Sensitive     IMIPENEM <=0.25 SENSITIVE Sensitive     NITROFURANTOIN <=16 SENSITIVE Sensitive     TRIMETH/SULFA <=20 SENSITIVE Sensitive     AMPICILLIN/SULBACTAM >=32 RESISTANT Resistant     PIP/TAZO <=4 SENSITIVE Sensitive     * >=100,000 COLONIES/mL ESCHERICHIA COLI  Resp Panel by RT-PCR (Flu A&B, Covid) Nasopharyngeal Swab     Status: None   Collection Time: 05/07/20  5:07 PM   Specimen: Nasopharyngeal Swab; Nasopharyngeal(NP) swabs in vial transport medium  Result Value Ref Range Status   SARS Coronavirus 2 by RT PCR NEGATIVE  NEGATIVE Final    Comment: (NOTE) SARS-CoV-2 target nucleic acids are NOT DETECTED.  The SARS-CoV-2 RNA is generally detectable in upper respiratory specimens during the acute phase of infection. The lowest concentration of SARS-CoV-2 viral copies this assay can detect is 138 copies/mL. A negative result does not preclude SARS-Cov-2 infection and should not be used as the sole basis for treatment or other patient management decisions. A negative result may occur with   improper specimen collection/handling, submission of specimen other than nasopharyngeal swab, presence of viral mutation(s) within the areas targeted by this assay, and inadequate number of viral copies(<138 copies/mL). A negative result must be combined with clinical observations, patient history, and epidemiological information. The expected result is Negative.  Fact Sheet for Patients:  BloggerCourse.com  Fact Sheet for Healthcare Providers:  SeriousBroker.it  This test is no t yet approved or cleared by the [place] FDA and  has been authorized for detection and/or diagnosis of SARS-CoV-2 by FDA under an Emergency Use Authorization (EUA). This EUA will remain  in effect (meaning this test can be used) for the duration of the COVID-19 declaration under Section 564(b)(1) of the Act, 21 U.S.C.section 360bbb-3(b)(1), unless the authorization is terminated  or revoked sooner.       Influenza A by PCR NEGATIVE NEGATIVE Final   Influenza B by PCR NEGATIVE NEGATIVE Final    Comment: (NOTE) The Xpert Xpress SARS-CoV-2/FLU/RSV plus assay is intended as an aid in the diagnosis of influenza from Nasopharyngeal swab specimens and should not be used as a sole basis for treatment. Nasal washings and aspirates are unacceptable for Xpert Xpress SARS-CoV-2/FLU/RSV testing.  Fact Sheet for Patients: BloggerCourse.com  Fact Sheet for Healthcare Providers: SeriousBroker.it  This test is not yet approved or cleared by the [place] FDA and has been authorized for detection and/or diagnosis of SARS-CoV-2 by FDA under an Emergency Use Authorization (EUA). This EUA will remain in effect (meaning this test can be used) for the duration of the COVID-19 declaration under Section 564(b)(1) of the Act, 21 U.S.C. section 360bbb-3(b)(1), unless the authorization is terminated  or revoked.  Performed at [facility], [address] [place], [place] [address]   Culture, blood (single)     Status: None   Collection Time: 05/07/20  5:15 PM   Specimen: BLOOD LEFT WRIST  Result Value Ref Range Status   Specimen Description   Final    BLOOD LEFT WRIST Performed at [facility], [address] [place], [place] [address]    Special Requests   Final    BOTTLES DRAWN AEROBIC AND ANAEROBIC Blood Culture adequate volume Performed at [facility], [address] [place], [place] [address]    Culture   Final    NO GROWTH 5 DAYS Performed at [facility], [address] [place], [place] [address]    Report Status 05/12/2020 FINAL  Final    Radiology Reports DG Chest 1 View  Result Date: 05/07/2020 CLINICAL DATA:  Sacral wound.  No fever or cough. EXAM: CHEST  1 VIEW COMPARISON:  11/22/2019 FINDINGS: Cardiac silhouette is normal in size. No mediastinal or hilar masses or evidence of adenopathy. Clear lungs.  No pleural effusion or pneumothorax. Skeletal structures are demineralized but grossly intact. IMPRESSION: No active disease. Electronically Signed   By: Amie Portland M.D.   On: 05/07/2020 18:14   DG Pelvis 1-2 Views  Result Date: 05/07/2020 CLINICAL DATA:  Sacral wound.  Possible osteomyelitis. EXAM: PELVIS - 1-2 VIEW COMPARISON:  None. FINDINGS: Sacrum partly obscured by overlying bowel gas and stool. No fracture. No bone lesion. No visible bone resorption to suggest osteomyelitis. Hip joints, SI joints and symphysis pubis are normally spaced and aligned. Soft tissues are unremarkable. IMPRESSION: 1. No fracture, bone lesion or evidence of osteomyelitis. Mid to lower sacrum not well visualized. Electronically Signed   By: Amie Portland M.D.   On: 05/07/2020 18:15   CT HEAD WO CONTRAST  Result Date: 05/08/2020 CLINICAL DATA:  Mental status change, unknown cause. Additional provided: Patient admitted with wound infection,  dementia. EXAM: CT HEAD WITHOUT CONTRAST TECHNIQUE: Contiguous axial images were obtained from the base of the skull through the vertex without intravenous contrast. COMPARISON:  Prior noncontrast head CT examination 11/22/2019 and earlier. FINDINGS: Brain: Mild-to-moderate cerebral atrophy with a medial temporal lobe predominance. Advanced ill-defined hypoattenuation within the cerebral white matter is nonspecific, but compatible chronic small vessel ischemic disease. There is no acute intracranial hemorrhage. No demarcated cortical infarct. No extra-axial fluid collection. No evidence of intracranial mass. No midline shift. Vascular: No hyperdense vessel.  Atherosclerotic calcifications. Skull: Normal. Negative for fracture or focal lesion. Sinuses/Orbits: Visualized orbits show no acute finding. Minimal ethmoid sinus mucosal thickening. Other: Trace bilateral mastoid effusions. IMPRESSION: No evidence of acute intracranial abnormality. Mild/moderate cerebral atrophy with a medial temporal lobe predominance. Advanced chronic small vessel ischemic disease. Small bilateral mastoid effusions. Electronically Signed   By: Jackey Loge DO   On: 05/08/2020 16:47     Time Spent in minutes  30     Laverna Peace M.D on 05/16/2020 at 1:04 PM  To page go to [email or web address] - password [email or web address]

## 2020-05-16 NOTE — Care Management Important Message (Signed)
Important Message  Patient Details IM Letter given to the Patient. Name: Melinda Love MRN: [id number] Date of Birth: 1933-10-30   Medicare Important Message Given:  Yes     Caren Macadam 05/16/2020, 1:27 PM

## 2020-05-16 NOTE — TOC Transition Note (Signed)
Transition of Care [facility]) - CM/SW Discharge Note   Patient Details  Name: Melinda Love MRN: [id number] Date of Birth: 11-15-1933  Transition of [facility]) CM/SW Contact:  Darleene Cleaver, LCSW Phone Number: 05/16/2020, 2:35 PM   Clinical Narrative:     Patient has been approved for [facility].  CSW updated physician who is completing discharge summary.  Patient to be d/c'ed today to [facility].  Patient and family agreeable to plans will transport via [facility] RN to call report to [phone], receiving physician is Dr. Luna Kitchens.  CSW updated paitent's daughter.      Final next level of care: Long Term Acute Care (LTAC) Barriers to Discharge: Barriers Resolved   Patient Goals and CMS Choice Patient states their goals for this hospitalization and ongoing recovery are:: To go to [facility] and continue wound healing. CMS Medicare.gov Compare Post Acute Care list provided to:: Patient Represenative (must comment) Choice offered to / list presented to : Adult Children  Discharge Placement              Patient chooses bed at: Other - please specify in the comment section below: ([facility]) Patient to be transferred to facility by: [facility] Name of family member notified: Berline Lopes [phone] Patient and family notified of of transfer: 05/16/20  Discharge Plan and Services In-house Referral: Clinical Social Work   Post Acute Care Choice: Skilled Nursing Facility                               Social Determinants of Health (SDOH) Interventions     Readmission Risk Interventions No flowsheet data found.

## 2020-05-16 NOTE — Progress Notes (Signed)
Physical Therapy Hydrotherapy Treatment Note    05/16/20 1200  Subjective Assessment  Subjective Pt mostly sleeping during session, occasional moaning with pain.  Patient and Family Stated Goals per daughter, to heal wound, walk again.  Date of Onset  (present on adm.)  Prior Treatments seen in Wound clinic, dressing changes   Evaluation and Treatment  Evaluation and Treatment Procedures Explained to Patient/Family Yes  Evaluation and Treatment Procedures Patient unable to consent due to mental status  Pressure Injury 05/09/20 Sacrum Right;Mid Stage 4 - Full thickness tissue loss with exposed bone, tendon or muscle. PT ONLY- wound is on the right guteal area, above gluteal cleft.   Date First Assessed/Time First Assessed: 05/09/20 1100   Location: Sacrum  Location Orientation: Right;Mid  Staging: Stage 4 - Full thickness tissue loss with exposed bone, tendon or muscle.  Wound Description (Comments): PT ONLY- wound is on the righ...  Dressing Type Foam - Lift dressing to assess site every shift;Normal saline moist dressing;Other (Comment) (santyl)  Dressing Changed  Dressing Change Frequency Daily  State of Healing Early/partial granulation  Site / Wound Assessment Red;Bleeding;Painful (sacral ligament visible)  % Wound base Red or Granulating 50%  % Wound base Yellow/Fibrinous Exudate 10%  % Wound base Other/Granulation Tissue (Comment) 40% (sacral ligament)  Peri-wound Assessment Intact  Margins Unattached edges (unapproximated)  Drainage Amount Moderate  Drainage Description Green;Serosanguineous  Treatment Packing (Saline gauze);Off loading;Hydrotherapy (Pulse lavage);Debridement (Selective)  Hydrotherapy  Pulsed lavage therapy - wound location right, lateral to  sacrum  Pulsed Lavage with Suction (psi) 4 psi  Pulsed Lavage with Suction - Normal Saline Used 1000 mL  Pulsed Lavage Tip Tip with splash shield  Selective Debridement  Selective Debridement - Location right  sacral/gluteal area  Selective Debridement - Tools Used Forceps;Scissors  Selective Debridement - Tissue Removed very little yellow slough  Wound Therapy - Assess/Plan/Recommendations  Wound Therapy - Clinical Statement Pt less alert today and mostly slept during session.  Pt did still have occasional discomfort/pain during pulsatile lavage.  Green exudate present on old dressing, so notified Dr. Lonny Prude.  Wound Therapy - Functional Problem List non ambulatory  Factors Delaying/Impairing Wound Healing Incontinence;Immobility  Hydrotherapy Plan Debridement;Dressing change;Pulsatile lavage with suction;Patient/family education  Wound Therapy - Frequency 3X / week  Wound Therapy - Current Recommendations PT;Case manager/social work  Wound Therapy - Follow Up Recommendations Skilled nursing facility [facility])  Wound Plan Updated frequecy to 3x/week.  Pt with very little slough present which can likely be removed with continued dressing changes however will check back on progress of wound if pt remains in the hospital setting.  Pt will not need hydrotherapy upon d/c at this time.  Wound Therapy Goals - Improve the function of patient's integumentary system by progressing the wound(s) through the phases of wound healing by:  Decrease Necrotic Tissue to 10  Decrease Necrotic Tissue - Progress Updated due to goal met  Increase Granulation Tissue to 90  Increase Granulation Tissue - Progress Updated due to goal met  Improve Drainage Characteristics Min  Improve Drainage Characteristics - Progress Progressing toward goal  Goals/treatment plan/discharge plan were made with and agreed upon by patient/family Yes  Time For Goal Achievement 2 weeks  Wound Therapy - Potential for Goals Fair   Time: [place], DPT Acute Rehabilitation Services Pager: [phone] Office: [phone]

## 2020-05-16 NOTE — TOC Progression Note (Addendum)
Transition of Care [facility]) - Progression Note    Patient Details  Name: Melinda Love MRN: [id number] Date of Birth: [birth year]  [facility]) CM/SW Contact  Darleene Cleaver, [place] Phone Number: 05/16/2020, 10:38 AM  Clinical Narrative:     CSW spoke to patient's daughter Berline Lopes, [phone], she stated she spoke to [facility] wound care nurse this morning, and she would like to wait till the physician and PT feel patient does not need hydrotherapy anymore before she plans to have her transfer to one of the LTACs.  CSW asked if patient's daughter would like to speak to [facility] as well to hear what they can offer.  Patient's daughter stated she would like to speak to [facility] too, CSW contacted Candise Bowens at [facility] to let her know that daughter would like a phone call.  CSW to continue to follow patient's progress throughout discharge planning.  2:10pm  CSW received phone call from daughter she chose to have patient go to [facility].  CSW contacted LTAC to see when bed is available.  CSW awaiting for response back.  2:31pm  CSW received phone call from [facility] at [facility], they have a bed available today and can accept her.  CSW updated attending physician, bedside nurse, and patient's daughter.  Expected Discharge Plan: Skilled Nursing Facility Barriers to Discharge: Continued Medical Work up  Expected Discharge Plan and Services Expected Discharge Plan: Skilled Nursing Facility In-house Referral: Clinical Social Work   Post Acute Care Choice: Skilled Nursing Facility Living arrangements for the past 2 months: Assisted Living Facility ([facility])                                       Social Determinants of Health (SDOH) Interventions    Readmission Risk Interventions No flowsheet data found.

## 2020-05-16 NOTE — Progress Notes (Signed)
Pt left via carelink 

## 2020-05-17 LAB — CBC WITH DIFFERENTIAL/PLATELET
Abs Immature Granulocytes: 0.08 10*3/uL — ABNORMAL HIGH (ref 0.00–0.07)
Basophils Absolute: 0.1 10*3/uL (ref 0.0–0.1)
Basophils Relative: 0 %
Eosinophils Absolute: 0.1 10*3/uL (ref 0.0–0.5)
Eosinophils Relative: 1 %
HCT: 36.9 % (ref 36.0–46.0)
Hemoglobin: 11.4 g/dL — ABNORMAL LOW (ref 12.0–15.0)
Immature Granulocytes: 1 %
Lymphocytes Relative: 20 %
Lymphs Abs: 2.7 10*3/uL (ref 0.7–4.0)
MCH: 26.6 pg (ref 26.0–34.0)
MCHC: 30.9 g/dL (ref 30.0–36.0)
MCV: 86 fL (ref 80.0–100.0)
Monocytes Absolute: 0.8 10*3/uL (ref 0.1–1.0)
Monocytes Relative: 6 %
Neutro Abs: 10.1 10*3/uL — ABNORMAL HIGH (ref 1.7–7.7)
Neutrophils Relative %: 72 %
Platelets: 374 10*3/uL (ref 150–400)
RBC: 4.29 MIL/uL (ref 3.87–5.11)
RDW: 14.5 % (ref 11.5–15.5)
WBC: 13.9 10*3/uL — ABNORMAL HIGH (ref 4.0–10.5)
nRBC: 0 % (ref 0.0–0.2)

## 2020-05-17 LAB — COMPREHENSIVE METABOLIC PANEL
ALT: 14 U/L (ref 0–44)
AST: 20 U/L (ref 15–41)
Albumin: 2.3 g/dL — ABNORMAL LOW (ref 3.5–5.0)
Alkaline Phosphatase: 78 U/L (ref 38–126)
Anion gap: 10 (ref 5–15)
BUN: 22 mg/dL (ref 8–23)
CO2: 25 mmol/L (ref 22–32)
Calcium: 8.4 mg/dL — ABNORMAL LOW (ref 8.9–10.3)
Chloride: 90 mmol/L — ABNORMAL LOW (ref 98–111)
Creatinine, Ser: 0.86 mg/dL (ref 0.44–1.00)
GFR, Estimated: >60 mL/min (ref >60)
Glucose, Bld: 81 mg/dL (ref 70–99)
Potassium: 3.7 mmol/L (ref 3.5–5.1)
Sodium: 125 mmol/L — ABNORMAL LOW (ref 135–145)
Total Bilirubin: 0.3 mg/dL (ref 0.3–1.2)
Total Protein: 5.7 g/dL — ABNORMAL LOW (ref 6.5–8.1)

## 2020-05-17 LAB — PHOSPHORUS: Phosphorus: 3.7 mg/dL (ref 2.5–4.6)

## 2020-05-17 LAB — PROTIME-INR
INR: 1.1 (ref 0.8–1.2)
Prothrombin Time: 13.3 seconds (ref 11.4–15.2)

## 2020-05-17 LAB — CORTISOL: Cortisol, Plasma: 15.6 ug/dL

## 2020-05-17 LAB — OSMOLALITY: Osmolality: 300 mOsm/kg — ABNORMAL HIGH (ref 275–295)

## 2020-05-17 LAB — MAGNESIUM: Magnesium: 2 mg/dL (ref 1.7–2.4)

## 2020-05-17 LAB — TSH: TSH: 8.367 u[IU]/mL — ABNORMAL HIGH (ref 0.350–4.500)

## 2020-05-18 LAB — BASIC METABOLIC PANEL
Anion gap: 9 (ref 5–15)
BUN: 19 mg/dL (ref 8–23)
CO2: 27 mmol/L (ref 22–32)
Calcium: 8.9 mg/dL (ref 8.9–10.3)
Chloride: 104 mmol/L (ref 98–111)
Creatinine, Ser: 0.8 mg/dL (ref 0.44–1.00)
GFR, Estimated: >60 mL/min (ref >60)
Glucose, Bld: 96 mg/dL (ref 70–99)
Potassium: 3.5 mmol/L (ref 3.5–5.1)
Sodium: 140 mmol/L (ref 135–145)

## 2020-05-20 NOTE — Clinical Social Work Note (Signed)
CSW received a phone call from patient's daughter Ardis Hughs, [phone].  She stated that [facility] is at [facility] and they are saying that patient does not meet Medicare guidelines to stay at [facility].  Patient's daughter asked how can that be when patient was just admitted.  CSW explained to her that this CSW goes off of what [facility] says after they evaluate patient and decide if she meets criteria.  CSW was informed patient met criteria, so disposition was to go to [facility].  CSW informed patient that there is not much this CSW can do now, accept contact liaison from [facility] to see if she can have someone intervene and talk with patient's daughter.  Patient's daughter accepted that and she expressed frustration about being told patient does not meet criteria anymore.  Patient's daughter expressed that she was being told that patient has to go to a SNF now because the wound care she is receiving does not meet intensity of LTAC.  CSW reiterated, that that is something that facility will have to address with her.  CSW called Delsa Sale at [facility]	[place] and she was going to notify her supervisor to speak to patient's daughter.  Jones Broom. Elaura Calix, MSW, LCSW [phone]  05/20/2020 5:11 PM

## 2020-05-21 LAB — BASIC METABOLIC PANEL
Anion gap: 8 (ref 5–15)
BUN: 22 mg/dL (ref 8–23)
CO2: 30 mmol/L (ref 22–32)
Calcium: 9.6 mg/dL (ref 8.9–10.3)
Chloride: 103 mmol/L (ref 98–111)
Creatinine, Ser: 0.84 mg/dL (ref 0.44–1.00)
GFR, Estimated: >60 mL/min (ref >60)
Glucose, Bld: 99 mg/dL (ref 70–99)
Potassium: 4 mmol/L (ref 3.5–5.1)
Sodium: 141 mmol/L (ref 135–145)

## 2020-05-21 LAB — CBC
HCT: 36.6 % (ref 36.0–46.0)
Hemoglobin: 11.8 g/dL — ABNORMAL LOW (ref 12.0–15.0)
MCH: 27.8 pg (ref 26.0–34.0)
MCHC: 32.2 g/dL (ref 30.0–36.0)
MCV: 86.3 fL (ref 80.0–100.0)
Platelets: 349 10*3/uL (ref 150–400)
RBC: 4.24 MIL/uL (ref 3.87–5.11)
RDW: 15 % (ref 11.5–15.5)
WBC: 14.6 10*3/uL — ABNORMAL HIGH (ref 4.0–10.5)
nRBC: 0 % (ref 0.0–0.2)

## 2020-05-21 LAB — MAGNESIUM: Magnesium: 2.1 mg/dL (ref 1.7–2.4)

## 2020-05-25 LAB — SARS CORONAVIRUS 2 (TAT 6-24 HRS): SARS Coronavirus 2: NEGATIVE

## 2020-05-29 LAB — CBC
HCT: 37 % (ref 36.0–46.0)
Hemoglobin: 12.2 g/dL (ref 12.0–15.0)
MCH: 28 pg (ref 26.0–34.0)
MCHC: 33 g/dL (ref 30.0–36.0)
MCV: 85.1 fL (ref 80.0–100.0)
Platelets: 346 10*3/uL (ref 150–400)
RBC: 4.35 MIL/uL (ref 3.87–5.11)
RDW: 15.5 % (ref 11.5–15.5)
WBC: 15.4 10*3/uL — ABNORMAL HIGH (ref 4.0–10.5)
nRBC: 0 % (ref 0.0–0.2)

## 2020-05-29 LAB — BASIC METABOLIC PANEL
Anion gap: 10 (ref 5–15)
BUN: 32 mg/dL — ABNORMAL HIGH (ref 8–23)
CO2: 25 mmol/L (ref 22–32)
Calcium: 9.3 mg/dL (ref 8.9–10.3)
Chloride: 102 mmol/L (ref 98–111)
Creatinine, Ser: 0.96 mg/dL (ref 0.44–1.00)
GFR, Estimated: 58 mL/min — ABNORMAL LOW (ref >60)
Glucose, Bld: 118 mg/dL — ABNORMAL HIGH (ref 70–99)
Potassium: 4 mmol/L (ref 3.5–5.1)
Sodium: 137 mmol/L (ref 135–145)

## 2020-06-01 LAB — SARS CORONAVIRUS 2 (TAT 6-24 HRS): SARS Coronavirus 2: NEGATIVE

## 2020-06-02 LAB — CBC
HCT: 37.2 % (ref 36.0–46.0)
Hemoglobin: 11.8 g/dL — ABNORMAL LOW (ref 12.0–15.0)
MCH: 27.3 pg (ref 26.0–34.0)
MCHC: 31.7 g/dL (ref 30.0–36.0)
MCV: 86.1 fL (ref 80.0–100.0)
Platelets: 317 10*3/uL (ref 150–400)
RBC: 4.32 MIL/uL (ref 3.87–5.11)
RDW: 15.4 % (ref 11.5–15.5)
WBC: 15.3 10*3/uL — ABNORMAL HIGH (ref 4.0–10.5)
nRBC: 0 % (ref 0.0–0.2)

## 2020-06-02 LAB — URINALYSIS, ROUTINE W REFLEX MICROSCOPIC
Bilirubin Urine: NEGATIVE
Glucose, UA: NEGATIVE mg/dL
Hgb urine dipstick: NEGATIVE
Ketones, ur: NEGATIVE mg/dL
Leukocytes,Ua: NEGATIVE
Nitrite: NEGATIVE
Protein, ur: NEGATIVE mg/dL
Specific Gravity, Urine: 1.013 (ref 1.005–1.030)
pH: 7 (ref 5.0–8.0)

## 2020-06-03 ENCOUNTER — Other Ambulatory Visit (HOSPITAL_COMMUNITY): Payer: Medicare Other

## 2020-06-03 LAB — URINE CULTURE: Culture: NO GROWTH

## 2020-06-06 LAB — CBC
HCT: 38.8 % (ref 36.0–46.0)
Hemoglobin: 12 g/dL (ref 12.0–15.0)
MCH: 27.3 pg (ref 26.0–34.0)
MCHC: 30.9 g/dL (ref 30.0–36.0)
MCV: 88.2 fL (ref 80.0–100.0)
Platelets: 312 10*3/uL (ref 150–400)
RBC: 4.4 MIL/uL (ref 3.87–5.11)
RDW: 16 % — ABNORMAL HIGH (ref 11.5–15.5)
WBC: 15.5 10*3/uL — ABNORMAL HIGH (ref 4.0–10.5)
nRBC: 0 % (ref 0.0–0.2)

## 2020-06-06 LAB — BASIC METABOLIC PANEL
Anion gap: 12 (ref 5–15)
BUN: 34 mg/dL — ABNORMAL HIGH (ref 8–23)
CO2: 26 mmol/L (ref 22–32)
Calcium: 9.7 mg/dL (ref 8.9–10.3)
Chloride: 102 mmol/L (ref 98–111)
Creatinine, Ser: 0.81 mg/dL (ref 0.44–1.00)
GFR, Estimated: >60 mL/min (ref >60)
Glucose, Bld: 96 mg/dL (ref 70–99)
Potassium: 3.8 mmol/L (ref 3.5–5.1)
Sodium: 140 mmol/L (ref 135–145)

## 2020-06-06 LAB — MAGNESIUM: Magnesium: 2 mg/dL (ref 1.7–2.4)

## 2020-06-08 LAB — BASIC METABOLIC PANEL
Anion gap: 12 (ref 5–15)
BUN: 23 mg/dL (ref 8–23)
CO2: 25 mmol/L (ref 22–32)
Calcium: 9.6 mg/dL (ref 8.9–10.3)
Chloride: 103 mmol/L (ref 98–111)
Creatinine, Ser: 0.76 mg/dL (ref 0.44–1.00)
GFR, Estimated: >60 mL/min (ref >60)
Glucose, Bld: 104 mg/dL — ABNORMAL HIGH (ref 70–99)
Potassium: 3.9 mmol/L (ref 3.5–5.1)
Sodium: 140 mmol/L (ref 135–145)

## 2020-06-08 LAB — CBC
HCT: 37.5 % (ref 36.0–46.0)
Hemoglobin: 11.9 g/dL — ABNORMAL LOW (ref 12.0–15.0)
MCH: 27.9 pg (ref 26.0–34.0)
MCHC: 31.7 g/dL (ref 30.0–36.0)
MCV: 88 fL (ref 80.0–100.0)
Platelets: 304 10*3/uL (ref 150–400)
RBC: 4.26 MIL/uL (ref 3.87–5.11)
RDW: 16 % — ABNORMAL HIGH (ref 11.5–15.5)
WBC: 15.9 10*3/uL — ABNORMAL HIGH (ref 4.0–10.5)
nRBC: 0 % (ref 0.0–0.2)

## 2020-06-08 LAB — PHOSPHORUS: Phosphorus: 4.3 mg/dL (ref 2.5–4.6)

## 2020-06-08 LAB — MAGNESIUM: Magnesium: 2 mg/dL (ref 1.7–2.4)

## 2020-06-09 LAB — URINALYSIS, ROUTINE W REFLEX MICROSCOPIC
Bilirubin Urine: NEGATIVE
Glucose, UA: NEGATIVE mg/dL
Hgb urine dipstick: NEGATIVE
Ketones, ur: NEGATIVE mg/dL
Leukocytes,Ua: NEGATIVE
Nitrite: NEGATIVE
Protein, ur: NEGATIVE mg/dL
Specific Gravity, Urine: 1.019 (ref 1.005–1.030)
pH: 5 (ref 5.0–8.0)

## 2020-06-10 LAB — BASIC METABOLIC PANEL
Anion gap: 12 (ref 5–15)
BUN: 23 mg/dL (ref 8–23)
CO2: 28 mmol/L (ref 22–32)
Calcium: 9.5 mg/dL (ref 8.9–10.3)
Chloride: 99 mmol/L (ref 98–111)
Creatinine, Ser: 0.85 mg/dL (ref 0.44–1.00)
GFR, Estimated: >60 mL/min (ref >60)
Glucose, Bld: 92 mg/dL (ref 70–99)
Potassium: 3.7 mmol/L (ref 3.5–5.1)
Sodium: 139 mmol/L (ref 135–145)

## 2020-06-10 LAB — SARS CORONAVIRUS 2 (TAT 6-24 HRS): SARS Coronavirus 2: NEGATIVE

## 2020-06-10 LAB — URINE CULTURE: Culture: NO GROWTH

## 2020-06-10 LAB — CBC
HCT: 39.6 % (ref 36.0–46.0)
Hemoglobin: 12.3 g/dL (ref 12.0–15.0)
MCH: 27.5 pg (ref 26.0–34.0)
MCHC: 31.1 g/dL (ref 30.0–36.0)
MCV: 88.4 fL (ref 80.0–100.0)
Platelets: 286 10*3/uL (ref 150–400)
RBC: 4.48 MIL/uL (ref 3.87–5.11)
RDW: 15.7 % — ABNORMAL HIGH (ref 11.5–15.5)
WBC: 15.4 10*3/uL — ABNORMAL HIGH (ref 4.0–10.5)
nRBC: 0 % (ref 0.0–0.2)

## 2020-06-10 LAB — MAGNESIUM: Magnesium: 2.1 mg/dL (ref 1.7–2.4)

## 2020-08-07 ENCOUNTER — Inpatient Hospital Stay (HOSPITAL_COMMUNITY)
Admission: EM | Admit: 2020-08-07 | Discharge: 2020-08-08 | DRG: 056 | Disposition: A | Discharge status: Hospice | Payer: Medicare Other | Attending: Internal Medicine | Admitting: Internal Medicine

## 2020-08-07 ENCOUNTER — Emergency Department (HOSPITAL_COMMUNITY): Payer: Medicare Other

## 2020-08-07 DIAGNOSIS — Z880 Allergy status to penicillin: Secondary | ICD-10-CM

## 2020-08-07 DIAGNOSIS — Z882 Allergy status to sulfonamides status: Secondary | ICD-10-CM

## 2020-08-07 DIAGNOSIS — Z7401 Bed confinement status: Secondary | ICD-10-CM

## 2020-08-07 DIAGNOSIS — Z66 Do not resuscitate: Secondary | ICD-10-CM | POA: Diagnosis present

## 2020-08-07 DIAGNOSIS — Z20822 Contact with and (suspected) exposure to covid-19: Secondary | ICD-10-CM | POA: Diagnosis present

## 2020-08-07 DIAGNOSIS — G309 Alzheimer's disease, unspecified: Principal | ICD-10-CM | POA: Diagnosis present

## 2020-08-07 DIAGNOSIS — F0280 Dementia in other diseases classified elsewhere without behavioral disturbance: Secondary | ICD-10-CM | POA: Diagnosis present

## 2020-08-07 DIAGNOSIS — Z883 Allergy status to other anti-infective agents status: Secondary | ICD-10-CM | POA: Diagnosis not present

## 2020-08-07 DIAGNOSIS — Z7189 Other specified counseling: Secondary | ICD-10-CM | POA: Diagnosis not present

## 2020-08-07 DIAGNOSIS — L89154 Pressure ulcer of sacral region, stage 4: Secondary | ICD-10-CM | POA: Diagnosis present

## 2020-08-07 DIAGNOSIS — Z789 Other specified health status: Secondary | ICD-10-CM | POA: Diagnosis not present

## 2020-08-07 DIAGNOSIS — Z7982 Long term (current) use of aspirin: Secondary | ICD-10-CM

## 2020-08-07 DIAGNOSIS — Z91041 Radiographic dye allergy status: Secondary | ICD-10-CM | POA: Diagnosis not present

## 2020-08-07 DIAGNOSIS — Z79899 Other long term (current) drug therapy: Secondary | ICD-10-CM | POA: Diagnosis not present

## 2020-08-07 DIAGNOSIS — F0281 Dementia in other diseases classified elsewhere with behavioral disturbance: Secondary | ICD-10-CM | POA: Diagnosis not present

## 2020-08-07 DIAGNOSIS — G9340 Encephalopathy, unspecified: Secondary | ICD-10-CM

## 2020-08-07 DIAGNOSIS — R5381 Other malaise: Secondary | ICD-10-CM

## 2020-08-07 DIAGNOSIS — Z515 Encounter for palliative care: Secondary | ICD-10-CM

## 2020-08-07 LAB — CBC WITH DIFFERENTIAL/PLATELET
Abs Immature Granulocytes: 0.06 10*3/uL (ref 0.00–0.07)
Basophils Absolute: 0.1 10*3/uL (ref 0.0–0.1)
Basophils Relative: 1 %
Eosinophils Absolute: 0.1 10*3/uL (ref 0.0–0.5)
Eosinophils Relative: 1 %
HCT: 40.8 % (ref 36.0–46.0)
Hemoglobin: 13 g/dL (ref 12.0–15.0)
Immature Granulocytes: 1 %
Lymphocytes Relative: 13 %
Lymphs Abs: 1.3 10*3/uL (ref 0.7–4.0)
MCH: 28 pg (ref 26.0–34.0)
MCHC: 31.9 g/dL (ref 30.0–36.0)
MCV: 87.7 fL (ref 80.0–100.0)
Monocytes Absolute: 0.8 10*3/uL (ref 0.1–1.0)
Monocytes Relative: 8 %
Neutro Abs: 8.3 10*3/uL — ABNORMAL HIGH (ref 1.7–7.7)
Neutrophils Relative %: 76 %
Platelets: 282 10*3/uL (ref 150–400)
RBC: 4.65 MIL/uL (ref 3.87–5.11)
RDW: 14.4 % (ref 11.5–15.5)
WBC: 10.6 10*3/uL — ABNORMAL HIGH (ref 4.0–10.5)
nRBC: 0 % (ref 0.0–0.2)

## 2020-08-07 LAB — COMPREHENSIVE METABOLIC PANEL
ALT: 20 U/L (ref 0–44)
AST: 27 U/L (ref 15–41)
Albumin: 2.8 g/dL — ABNORMAL LOW (ref 3.5–5.0)
Alkaline Phosphatase: 99 U/L (ref 38–126)
Anion gap: 12 (ref 5–15)
BUN: 18 mg/dL (ref 8–23)
CO2: 25 mmol/L (ref 22–32)
Calcium: 10 mg/dL (ref 8.9–10.3)
Chloride: 98 mmol/L (ref 98–111)
Creatinine, Ser: 1.03 mg/dL — ABNORMAL HIGH (ref 0.44–1.00)
GFR, Estimated: 54 mL/min — ABNORMAL LOW (ref >60)
Glucose, Bld: 83 mg/dL (ref 70–99)
Potassium: 4.8 mmol/L (ref 3.5–5.1)
Sodium: 135 mmol/L (ref 135–145)
Total Bilirubin: 1 mg/dL (ref 0.3–1.2)
Total Protein: 7.1 g/dL (ref 6.5–8.1)

## 2020-08-07 LAB — SARS CORONAVIRUS 2 (TAT 6-24 HRS): SARS Coronavirus 2: NEGATIVE

## 2020-08-07 LAB — AMMONIA: Ammonia: 30 umol/L (ref 9–35)

## 2020-08-07 MED ORDER — MORPHINE SULFATE (PF) 2 MG/ML IV SOLN: 2.0000 mg | INTRAVENOUS | Status: DC | PRN

## 2020-08-07 MED ORDER — HALOPERIDOL 1 MG PO TABS
1.0000 mg | ORAL_TABLET | Freq: Four times a day (QID) | ORAL | Status: DC | PRN
  Filled 2020-08-07: qty 1

## 2020-08-07 MED ORDER — GLYCOPYRROLATE 1 MG PO TABS
1.0000 mg | ORAL_TABLET | ORAL | Status: DC | PRN
Start: 2020-08-07 — End: 2020-08-08

## 2020-08-07 MED ORDER — ONDANSETRON 4 MG PO TBDP: 4.0000 mg | ORAL_TABLET | Freq: Four times a day (QID) | ORAL | Status: DC | PRN

## 2020-08-07 MED ORDER — LORAZEPAM 2 MG/ML PO CONC: 1.0000 mg | ORAL | Status: DC | PRN

## 2020-08-07 MED ORDER — BIOTENE DRY MOUTH MT LIQD: 15.0000 mL | OROMUCOSAL | Status: DC | PRN

## 2020-08-07 MED ORDER — LORAZEPAM 2 MG/ML IJ SOLN: 1.0000 mg | INTRAMUSCULAR | Status: DC | PRN

## 2020-08-07 MED ORDER — GLYCOPYRROLATE 0.2 MG/ML IJ SOLN: 0.2000 mg | INTRAMUSCULAR | Status: DC | PRN

## 2020-08-07 MED ORDER — ONDANSETRON HCL 4 MG/2ML IJ SOLN: 4.0000 mg | Freq: Four times a day (QID) | INTRAMUSCULAR | Status: DC | PRN

## 2020-08-07 MED ORDER — POLYVINYL ALCOHOL 1.4 % OP SOLN: 1.0000 [drp] | Freq: Four times a day (QID) | OPHTHALMIC | Status: DC | PRN

## 2020-08-07 MED ORDER — ACETAMINOPHEN 325 MG PO TABS: 650.0000 mg | ORAL_TABLET | Freq: Four times a day (QID) | ORAL | Status: DC | PRN

## 2020-08-07 MED ORDER — HALOPERIDOL LACTATE 5 MG/ML IJ SOLN
2.0000 mg | Freq: Four times a day (QID) | INTRAMUSCULAR | Status: DC | PRN
Start: 2020-08-07 — End: 2020-08-08

## 2020-08-07 MED ORDER — LORAZEPAM 1 MG PO TABS: 1.0000 mg | ORAL_TABLET | ORAL | Status: DC | PRN

## 2020-08-07 MED ORDER — HALOPERIDOL LACTATE 2 MG/ML PO CONC: 2.0000 mg | Freq: Four times a day (QID) | ORAL | Status: DC | PRN

## 2020-08-07 MED ORDER — ACETAMINOPHEN 650 MG RE SUPP: 650.0000 mg | Freq: Four times a day (QID) | RECTAL | Status: DC | PRN

## 2020-08-07 NOTE — Care Management (Signed)
ED RNCM received call from Girard Cooter NP with Palliative Care, concerning hospice care arrangements. Hospice care and goals of care have been discussed with patient's family and the decision was made by the family for [facility] as per A. Smith of Palliative.   ED RNCM  faxed referral to [facility]. TOC team will continue to follow patient for safe transitional care plan.

## 2020-08-07 NOTE — ED Notes (Signed)
Rolled pt to get rectal temp and she cried out stating her back hurts.

## 2020-08-07 NOTE — H&P (Signed)
History and Physical    Jamye Balicki PRF:[id number] DOB: 1933/06/27 DOA: 08/07/2020  PCP: Patient, No Pcp Per  Patient coming from: [facility] rehab  I have personally briefly reviewed patient's old medical records in [facility] Health Link  Chief Complaint: Altered mental status, functional decline  HPI: Melinda Love is a 87 y.o. female with medical history significant for Alzheimer's dementia and stage IV sacral decubitus ulcer who is brought to the ED from [facility] rehab facility for evaluation of altered mental status and functional decline.  Patient is unable to provide any history due to underlying dementia therefore entirety of history is obtained from EDP, chart review, and family at bedside.  Patient had recent hospitalization at [facility] in December 2021 for stage IV sacral decubitus ulcer management.  [facility] resides at [facility] rehab facility.  Her daughter states that patient has had progressive functional decline over the last few weeks however today it significantly diminished mental status and ability to interact or communicate needs.  Due to significant changes brought to the ED for further evaluation.  While in the ED after discussions with the emergency physician and palliative care, decision was made to focus on comfort care with anticipated transfer to hospice of [facility] when bed is available.  Family would like to evaluate for UTI and continue palliative wound care as well as using antibiotics if indicated.  ED Course:  Initial vitals showed BP 124/64, pulse 77, RR 17, temp 98.7 F, SPO2 99% on 2 L supplemental O2 via [place].  Labs show WBC 10.6, hemoglobin 13.0, platelets 282,000, sodium 135, potassium 4.8, bicarb 25, BUN 18, creatinine 1.03, serum glucose 83, LFTs within normal limits, ammonia 30.  SARS-CoV-2 PCR is ordered and pending.  Urinalysis ordered and awaiting collection.  Portable chest x-ray is  negative for acute cardiopulmonary abnormality.  Prior right rotator cuff surgical changes noted.  Palliative care were consulted in the ED and after ED physician and palliative care discussion with family the decision was made to only pursue comfort care measures with plan for transfer to inpatient hospice.  Currently no bed available at [facility] of [facility] which is preferred facility therefore the hospitalist service was consulted to admit for further care until hospice bed available.  Review of Systems:  Unable to obtain full review of systems due to dementia.  Past Medical History:  Diagnosis Date  . Alzheimer disease (HCC)   . Chronic back pain     Past Surgical History:  Procedure Laterality Date  . ABDOMINAL HYSTERECTOMY    . CHOLECYSTECTOMY      Social History:  reports that she has never smoked. She has never used smokeless tobacco. She reports that she does not drink alcohol and does not use drugs.  Allergies  Allergen Reactions  . Iodinated Diagnostic Agents Swelling and Other (See Comments)    Face and lips swell  . Sulfa Antibiotics Hives and Swelling  . Chloromycetin [Chloramphenicol] Other (See Comments)    "ALLERGIC," per MAR  . Penicillins Other (See Comments)    "ALLERGIC," per MAR  . Hydrocodone Rash    No family history on file.   Prior to Admission medications   Medication Sig Start Date End Date Taking? Authorizing Provider  amantadine (SYMMETREL) 100 MG capsule Take 100 mg by mouth in the morning.   Yes [provider]  aspirin EC 81 MG tablet Take 81 mg by mouth daily. Swallow whole.  Yes [provider]  DIALYVITE VITAMIN D3 MAX 1.25 MG (50000 UT) TABS Take 50,000 Units by mouth daily.   Yes [provider]  donepezil (ARICEPT) 10 MG tablet Take 10 mg by mouth at bedtime.   Yes [provider]  EFFER-K 20 MEQ TBEF Take 80 mEq by mouth in the morning and at bedtime.   Yes [provider]   memantine (NAMENDA) 10 MG tablet Take 10 mg by mouth daily at 12 noon.   Yes [provider]  modafinil (PROVIGIL) 100 MG tablet Take 100 mg by mouth daily.   Yes [provider]  Multiple Vitamin (TAB-A-VITE/BETA CAROTENE) TABS Take 1 tablet by mouth in the morning.   Yes [provider]  Omega-3 Fatty Acids (FISH OIL) 600 MG CAPS Take 600 mg by mouth daily.   Yes [provider]  sertraline (ZOLOFT) 100 MG tablet Take 100 mg by mouth in the morning.   Yes [provider]  sertraline (ZOLOFT) 25 MG tablet Take 25 mg by mouth in the morning.   Yes [provider]  vitamin B-12 (CYANOCOBALAMIN) 500 MCG tablet Take 500 mcg by mouth daily.   Yes [provider]  ascorbic acid (VITAMIN C) 500 MG tablet Take 1 tablet (500 mg total) by mouth daily. Patient not taking: Reported on 08/07/2020 05/17/20   Melinda Love, Melinda D, MD  collagenase (SANTYL) ointment Apply topically daily. Patient not taking: Reported on 08/07/2020 05/17/20   Melinda Love, Melinda D, MD  feeding supplement (ENSURE ENLIVE / ENSURE PLUS) LIQD Take 237 mLs by mouth 2 (two) times daily between meals. Patient not taking: Reported on 08/07/2020 05/16/20   Melinda Love, Melinda D, MD  furosemide (LASIX) 20 MG tablet Take 1 tablet (20 mg total) by mouth as needed for edema. Patient not taking: Reported on 08/07/2020 05/16/20   Melinda Love, Melinda D, MD  Multiple Vitamin (MULTIVITAMIN WITH MINERALS) TABS tablet Take 1 tablet by mouth daily. Patient not taking: Reported on 08/07/2020 05/17/20   Melinda Love, Melinda D, MD  Nutritional Supplements (,FEEDING SUPPLEMENT, PROSOURCE PLUS) liquid Take 30 mLs by mouth 2 (two) times daily between meals. Patient not taking: Reported on 08/07/2020 05/17/20   Melinda Love, Melinda D, MD  polyvinyl alcohol (LIQUIFILM TEARS) 1.4 % ophthalmic solution Place 1 drop into both eyes 2 (two) times daily. Patient not taking: Reported on 08/07/2020 05/16/20   Melinda Love, Melinda D, MD  polyvinyl  alcohol (LIQUIFILM TEARS) 1.4 % ophthalmic solution Place 1 drop into both eyes as needed for dry eyes. Patient not taking: Reported on 08/07/2020 05/16/20   Melinda Love, Melinda D, MD  risperiDONE (RISPERDAL) 0.5 MG tablet Take 1.5 tablets (0.75 mg total) by mouth at bedtime. Patient not taking: Reported on 08/07/2020 05/16/20   Melinda Love, Melinda D, MD  sertraline (ZOLOFT) 50 MG tablet Take 2.5 tablets (125 mg total) by mouth daily. Patient not taking: Reported on 08/07/2020 05/16/20   Melinda Love, Melinda D, MD  zinc sulfate 220 (50 Zn) MG capsule Take 1 capsule (220 mg total) by mouth daily. Patient not taking: Reported on 08/07/2020 05/17/20   Melinda Love, Melinda D, MD    Physical Exam: Vitals:   08/07/20 1700 08/07/20 1715 08/07/20 1730 08/07/20 1900  BP: 118/68 (!) 75/62 (!) 119/56 122/63  Pulse: 73 66 68 71  Resp: 16 14 16 15   Temp:      TempSrc:      SpO2: 98% 99% 99% 99%   Constitutional: Chronically ill-appearing elderly woman resting supine in bed, appears fatigued  with limited interactions Eyes: PERRL, lids and conjunctivae normal ENMT: Mucous membranes are dry. Posterior pharynx clear of any exudate or lesions. Neck: normal, supple, no masses. Respiratory: clear to auscultation anteriorly.  Normal respiratory effort. No accessory muscle use.  Cardiovascular: Regular rate and rhythm, no murmurs / rubs / gallops. No extremity edema. 2+ pedal pulses. Abdomen: no tenderness, no masses palpated. Bowel sounds positive.  Musculoskeletal: no clubbing / cyanosis. No joint deformity upper and lower extremities. No contractures. Skin: Unable to assess sacral ulcer. Neurologic: Mumbling speech. Sensation intact. Strength equal all extremities.  Intermittent jerking motion of both upper extremities. Psychiatric: Minimally interactive, not communicative.  Labs on Admission: I have personally reviewed following labs and imaging studies  CBC: Recent Labs  Lab 08/07/20 1540  WBC 10.6*  NEUTROABS 8.3*  HGB  13.0  HCT 40.8  MCV 87.7  PLT 282   Basic Metabolic Panel: Recent Labs  Lab 08/07/20 1540  NA 135  K 4.8  CL 98  CO2 25  GLUCOSE 83  BUN 18  CREATININE 1.03*  CALCIUM 10.0   GFR: CrCl cannot be calculated (Unknown ideal weight.). Liver Function Tests: Recent Labs  Lab 08/07/20 1540  AST 27  ALT 20  ALKPHOS 99  BILITOT 1.0  PROT 7.1  ALBUMIN 2.8*   No results for input(s): LIPASE, AMYLASE in the last 168 hours. Recent Labs  Lab 08/07/20 1541  AMMONIA 30   Coagulation Profile: No results for input(s): INR, PROTIME in the last 168 hours. Cardiac Enzymes: No results for input(s): CKTOTAL, CKMB, CKMBINDEX, TROPONINI in the last 168 hours. BNP (last 3 results) No results for input(s): PROBNP in the last 8760 hours. HbA1C: No results for input(s): HGBA1C in the last 72 hours. CBG: No results for input(s): GLUCAP in the last 168 hours. Lipid Profile: No results for input(s): CHOL, HDL, LDLCALC, TRIG, CHOLHDL, LDLDIRECT in the last 72 hours. Thyroid Function Tests: No results for input(s): TSH, T4TOTAL, FREET4, T3FREE, THYROIDAB in the last 72 hours. Anemia Panel: No results for input(s): VITAMINB12, FOLATE, FERRITIN, TIBC, IRON, RETICCTPCT in the last 72 hours. Urine analysis:    Component Value Date/Time   COLORURINE YELLOW 06/08/2020 1124   APPEARANCEUR CLEAR 06/08/2020 1124   LABSPEC 1.019 06/08/2020 1124   PHURINE 5.0 06/08/2020 1124   GLUCOSEU NEGATIVE 06/08/2020 1124   HGBUR NEGATIVE 06/08/2020 1124   BILIRUBINUR NEGATIVE 06/08/2020 1124   KETONESUR NEGATIVE 06/08/2020 1124   PROTEINUR NEGATIVE 06/08/2020 1124   NITRITE NEGATIVE 06/08/2020 1124   LEUKOCYTESUR NEGATIVE 06/08/2020 1124    Radiological Exams on Admission: DG Chest Portable 1 View  Result Date: 08/07/2020 CLINICAL DATA:  Altered mental status EXAM: PORTABLE CHEST 1 VIEW COMPARISON:  June 03, 2020 FINDINGS: The cardiomediastinal silhouette is unchanged in contour.Atherosclerotic  calcifications of the aorta. No pleural effusion. No pneumothorax. No acute pleuroparenchymal abnormality. Visualized abdomen is unremarkable. Multilevel degenerative changes of the thoracic spine. Status post RIGHT rotator cuff surgery. IMPRESSION: No acute cardiopulmonary abnormality. Electronically Signed   By: Meda Klinefelter MD   On: 08/07/2020 16:22    EKG: Not obtained.  Assessment/Plan Principal Problem:   End of life care Active Problems:   Sacral decubitus ulcer, stage IV (HCC)   Alzheimer's dementia (HCC)   Shaquille Murdy is a 87 y.o. female with medical history significant for Alzheimer's dementia and stage IV sacral decubitus ulcer who is admitted for end-of-life care.  End-stage dementia with functional decline admitted for end-of-life care: Seen by palliative care with anticipated  transfer to hospice of [facility] when bed is available.  Comfort care orders have been initiated. -Continue morphine, Haldol, Ativan as needed -Follow UA, add antibiotics if needed -Palliative care following -CODE STATUS is DNR/DNI -No further lab draws, tests, procedures  Stage IV sacral decubitus ulcer: Continue palliative wound care.  DVT prophylaxis: None Code Status: DNR/DNI Family Communication: Discussed with patient's daughter and 2 granddaughters at bedside Disposition Plan: From [facility] rehab and likely discharge to hospice of [facility] Consults called: Palliative care Level of care: Palliative Care Admission status:  Status is: Inpatient  Remains inpatient appropriate because:Unsafe Love/c plan   Dispo: The patient is from: [place] rehab              Anticipated Love/c is to: [facility]              Patient currently is not medically stable to Love/c.    Darreld Mclean MD [facility]  If 7PM-7AM, please contact night-coverage [email or web address]  08/07/2020, 7:45 PM

## 2020-08-07 NOTE — ED Notes (Signed)
Family at bedside. 

## 2020-08-07 NOTE — ED Provider Notes (Signed)
[facility] EMERGENCY DEPARTMENT Provider Note   CSN: [id number] Arrival date & time: 08/07/20  1518     History Chief Complaint  Patient presents with  . Altered Mental Status    Melinda Love is a 87 y.o. female.  Presents to the ER with concern for altered mental status.  Daughter reports patient has been having general decline over the last few months, worse over the last few weeks and seems to be abruptly worse over the last week.  Has a history of dementia, currently residing at a rehab facility.  Daughter reports that when she checked on her today, she was very confused, lethargic, not interactive.  Mental status much worse than baseline.  Reports that patient has advanced directives, DNR.    HPI     Past Medical History:  Diagnosis Date  . Alzheimer disease (HCC)   . Chronic back pain     Patient Active Problem List   Diagnosis Date Noted  . End of life care 08/07/2020  . Cellulitis 05/16/2020  . E-coli UTI 05/11/2020  . Acute metabolic encephalopathy 05/11/2020  . Alzheimer's dementia (HCC) 05/08/2020  . Leukocytosis 05/08/2020  . Hypokalemia 05/08/2020  . Anemia 05/08/2020  . Sacral decubitus ulcer, stage IV (HCC) 05/07/2020    Past Surgical History:  Procedure Laterality Date  . ABDOMINAL HYSTERECTOMY    . CHOLECYSTECTOMY       OB History   No obstetric history on file.     No family history on file.  Social History   Tobacco Use  . Smoking status: Never Smoker  . Smokeless tobacco: Never Used  Substance Use Topics  . Alcohol use: No  . Drug use: No    Home Medications Prior to Admission medications   Medication Sig Start Date End Date Taking? Authorizing Provider  amantadine (SYMMETREL) 100 MG capsule Take 100 mg by mouth in the morning.   Yes [provider]  aspirin EC 81 MG tablet Take 81 mg by mouth daily. Swallow whole.   Yes [provider]  DIALYVITE VITAMIN D3 MAX 1.25 MG (50000 UT) TABS Take  50,000 Units by mouth daily.   Yes [provider]  donepezil (ARICEPT) 10 MG tablet Take 10 mg by mouth at bedtime.   Yes [provider]  EFFER-K 20 MEQ TBEF Take 80 mEq by mouth in the morning and at bedtime.   Yes [provider]  memantine (NAMENDA) 10 MG tablet Take 10 mg by mouth daily at 12 noon.   Yes [provider]  modafinil (PROVIGIL) 100 MG tablet Take 100 mg by mouth daily.   Yes [provider]  Multiple Vitamin (TAB-A-VITE/BETA CAROTENE) TABS Take 1 tablet by mouth in the morning.   Yes [provider]  Omega-3 Fatty Acids (FISH OIL) 600 MG CAPS Take 600 mg by mouth daily.   Yes [provider]  sertraline (ZOLOFT) 100 MG tablet Take 100 mg by mouth in the morning.   Yes [provider]  sertraline (ZOLOFT) 25 MG tablet Take 25 mg by mouth in the morning.   Yes [provider]  vitamin B-12 (CYANOCOBALAMIN) 500 MCG tablet Take 500 mcg by mouth daily.   Yes [provider]  ascorbic acid (VITAMIN C) 500 MG tablet Take 1 tablet (500 mg total) by mouth daily. Patient not taking: Reported on 08/07/2020 05/17/20   Laverna Peace, MD  collagenase (SANTYL) ointment Apply topically daily. Patient not taking: Reported on 08/07/2020 05/17/20  Laverna Peace, MD  feeding supplement (ENSURE ENLIVE / ENSURE PLUS) LIQD Take 237 mLs by mouth 2 (two) times daily between meals. Patient not taking: Reported on 08/07/2020 05/16/20   Roberto Scales D, MD  furosemide (LASIX) 20 MG tablet Take 1 tablet (20 mg total) by mouth as needed for edema. Patient not taking: Reported on 08/07/2020 05/16/20   Laverna Peace, MD  Multiple Vitamin (MULTIVITAMIN WITH MINERALS) TABS tablet Take 1 tablet by mouth daily. Patient not taking: Reported on 08/07/2020 05/17/20   Laverna Peace, MD  Nutritional Supplements (,FEEDING SUPPLEMENT, PROSOURCE PLUS) liquid Take 30 mLs by mouth 2 (two) times daily between meals. Patient not  taking: Reported on 08/07/2020 05/17/20   Roberto Scales D, MD  polyvinyl alcohol (LIQUIFILM TEARS) 1.4 % ophthalmic solution Place 1 drop into both eyes 2 (two) times daily. Patient not taking: Reported on 08/07/2020 05/16/20   Roberto Scales D, MD  polyvinyl alcohol (LIQUIFILM TEARS) 1.4 % ophthalmic solution Place 1 drop into both eyes as needed for dry eyes. Patient not taking: Reported on 08/07/2020 05/16/20   Roberto Scales D, MD  risperiDONE (RISPERDAL) 0.5 MG tablet Take 1.5 tablets (0.75 mg total) by mouth at bedtime. Patient not taking: Reported on 08/07/2020 05/16/20   Roberto Scales D, MD  sertraline (ZOLOFT) 50 MG tablet Take 2.5 tablets (125 mg total) by mouth daily. Patient not taking: Reported on 08/07/2020 05/16/20   Roberto Scales D, MD  zinc sulfate 220 (50 Zn) MG capsule Take 1 capsule (220 mg total) by mouth daily. Patient not taking: Reported on 08/07/2020 05/17/20   Laverna Peace, MD    Allergies    Iodinated diagnostic agents, Sulfa antibiotics, Chloromycetin [chloramphenicol], Penicillins, and Hydrocodone  Review of Systems   Review of Systems  Unable to perform ROS: Dementia    Physical Exam Updated Vital Signs BP (!) 125/58 (BP Location: Left Arm)   Pulse 72   Temp 98.4 F (36.9 C) (Oral)   Resp 19   SpO2 100%   Physical Exam Vitals and nursing note reviewed.  Constitutional:      Appearance: She is well-developed and well-nourished.     Comments: Lying in bed, lethargic, responds to painful stimuli  HENT:     Head: Normocephalic and atraumatic.  Eyes:     Conjunctiva/sclera: Conjunctivae normal.  Cardiovascular:     Rate and Rhythm: Normal rate and regular rhythm.     Heart sounds: No murmur heard.   Pulmonary:     Effort: Pulmonary effort is normal. No respiratory distress.     Breath sounds: Normal breath sounds.  Abdominal:     Palpations: Abdomen is soft.     Tenderness: There is no abdominal tenderness.  Musculoskeletal:        General: No  edema.     Cervical back: Neck supple.  Skin:    General: Skin is warm and dry.  Neurological:     GCS: GCS eye subscore is 2. GCS verbal subscore is 4. GCS motor subscore is 5.     Comments: Readily responds to painful stimuli in all four extremities, confused verbal response, opens eyes to pain  Psychiatric:        Mood and Affect: Mood and affect normal.     ED Results / Procedures / Treatments   Labs (all labs ordered are listed, but only abnormal results are displayed) Labs Reviewed  CBC WITH DIFFERENTIAL/PLATELET - Abnormal; Notable for the following components:  Result Value   WBC 10.6 (*)    Neutro Abs 8.3 (*)    All other components within normal limits  COMPREHENSIVE METABOLIC PANEL - Abnormal; Notable for the following components:   Creatinine, Ser 1.03 (*)    Albumin 2.8 (*)    GFR, Estimated 54 (*)    All other components within normal limits  SARS CORONAVIRUS 2 (TAT 6-24 HRS)  AMMONIA  URINALYSIS, ROUTINE W REFLEX MICROSCOPIC    EKG None  Radiology DG Chest Portable 1 View  Result Date: 08/07/2020 CLINICAL DATA:  Altered mental status EXAM: PORTABLE CHEST 1 VIEW COMPARISON:  June 03, 2020 FINDINGS: The cardiomediastinal silhouette is unchanged in contour.Atherosclerotic calcifications of the aorta. No pleural effusion. No pneumothorax. No acute pleuroparenchymal abnormality. Visualized abdomen is unremarkable. Multilevel degenerative changes of the thoracic spine. Status post RIGHT rotator cuff surgery. IMPRESSION: No acute cardiopulmonary abnormality. Electronically Signed   By: Meda Klinefelter MD   On: 08/07/2020 16:22    Procedures Procedures   Medications Ordered in ED Medications  acetaminophen (TYLENOL) tablet 650 mg (has no administration in time range)    Or  acetaminophen (TYLENOL) suppository 650 mg (has no administration in time range)  haloperidol (HALDOL) tablet 1 mg (has no administration in time range)    Or  haloperidol  (HALDOL) 2 MG/ML solution 2 mg (has no administration in time range)    Or  haloperidol lactate (HALDOL) injection 2 mg (has no administration in time range)  ondansetron (ZOFRAN-ODT) disintegrating tablet 4 mg (has no administration in time range)    Or  ondansetron (ZOFRAN) injection 4 mg (has no administration in time range)  glycopyrrolate (ROBINUL) tablet 1 mg (has no administration in time range)    Or  glycopyrrolate (ROBINUL) injection 0.2 mg (has no administration in time range)    Or  glycopyrrolate (ROBINUL) injection 0.2 mg (has no administration in time range)  antiseptic oral rinse (BIOTENE) solution 15 mL (has no administration in time range)  polyvinyl alcohol (LIQUIFILM TEARS) 1.4 % ophthalmic solution 1 drop (has no administration in time range)  LORazepam (ATIVAN) tablet 1 mg (has no administration in time range)    Or  LORazepam (ATIVAN) 2 MG/ML concentrated solution 1 mg (has no administration in time range)    Or  LORazepam (ATIVAN) injection 1 mg (has no administration in time range)  morphine 2 MG/ML injection 2 mg (has no administration in time range)    ED Course  I have reviewed the triage vital signs and the nursing notes.  Pertinent labs & imaging results that were available during my care of the patient were reviewed by me and considered in my medical decision making (see chart for details).    MDM Rules/Calculators/A&P                         87 year old lady presenting to ER with concern for altered mental status.  History of dementia, significant functional decline over the past few weeks to months.  On exam, patient is very lethargic, responds to painful stimuli.  No clear focality to exam.  Basic labs were stable. I suspect most likely progression of her dementia.  I had lengthy goals of care conversation with patient's daughter at bedside.  They are very interested in pursuing comfort measures only for her mother.  Consulted palliative care.  Decision  has been made to transition patient to comfort measures only.  Patient will be admitted to the hospitalist  service while awaiting placement for inpatient hospice.  Final Clinical Impression(s) / ED Diagnoses Final diagnoses:  Encephalopathy  Palliative care status    Rx / DC Orders ED Discharge Orders    None       Milagros Lollykstra, Willford Rabideau S, MD 08/07/20 2359

## 2020-08-07 NOTE — Consult Note (Signed)
Consultation Note Date: 08/07/2020   Patient Name: Melinda Love  DOB: 07/13/1933  MRN: [id number]  Age / Sex: 87 y.o., female  PCP: Patient, No Pcp Per Referring Physician: Lucrezia Starch, MD  Reason for Consultation: Establishing goals of care and "hospice"  HPI/Patient Profile: 87 y.o. female  with past medical history of chronic back pain and Alzheimer's disease presented to the ED on 08/07/20 from [place] rehab facility with family complaints of AMS. Evaluation in ED lead to concern of overall deconditioning/decline and progression of advanced dementia prompting PMT consult. After daughter met with PMT in ED, patient will be admitted on 08/07/2020 for end of life care.   Clinical Assessment and Goals of Care: I have reviewed medical records including EPIC notes, labs, and imaging. Received report from primary RN - no acute concerns. RN reports patient is confused.    Went to visit patient at bedside - daughter/HCPOA/[name] present. Patient was lying in bed, lethargic - she opens her eyes to voice/gentle touch but does not focus on my presence. At times, she reaches up in the air trying to grasp something that is not there. She is not able to participate in conversation (noncommunicative) or able to make complex medical decisions. She does have signs and non-verbal gestures of pain/discomfort noted, which includes facial grimacing and body twitching. No respiratory distress, increased work of breathing, or secretions noted. She is on acute 2L O2 [place].   Met with daughter/HCPOA/[name] to discuss diagnosis, prognosis, GOC, EOL wishes, disposition, and options.  I introduced Palliative Medicine as specialized medical care for people living with serious illness. It focuses on providing relief from the symptoms and stress of a serious illness. The goal is to improve quality of life for both the patient and the  family.  We discussed a brief life review of the patient as well as functional and nutritional status. 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 Ardis Hughs describes her mother as a very independent woman, who would not consider her current state as having a good quality of life. Therapeutic listening was provided as [place] reflected on the events over the last year, which has lead to Ms. Saindon's current state. Ardis Hughs states the patient was first diagnosed with dementia in 2015; however experienced symptoms for several years before then. Since this time, the patient has had a slow gradual decline. In 2015 the patient lived alone, became increasingly agitated and confused - family sold her home and moved her into a condo with neighbors that would look after her. After some time, she then was moved into [facility] assisted living where she remained for 4-5 years. In August 2021 she was moved into memory care due to her progressing dementia. During this time, the patient developed a stage IV sacral pressure ulcer from being wheelchair bound. She had an episode of unresponsiveness due to wound infection/sepsis, which lead to her admission to [facility] in November 2021. At this time, Ardis Hughs was not interested in sending the patient to  rehab, but due to insurance reasons, was their only option. Therefor[facility]hab several weeks ago. Ardis Hughs explains that rehab has not been beneficial for the patient as she has continued to decline. She explains the patient is hallucinating, sleeping "a lot," incontinent of bowel and bladder, can no longer feed herself, is not eating/drinking,  and is now bed bound. Ardis Hughs also tells me the patient experiences dysphagia and can no longer communicate and slurs the few words she does try and speak.   We discussed patient's current illness and what it means in the larger context of patient's  on-going co-morbidities. Ardis Hughs clearly understands that dementia is a progressive, non-curable disease underlying the patient's current acute medical conditions. She also clearly understands the patient's current acute medical situation. Natural disease trajectory and expectations at EOL were discussed. I attempted to elicit values and goals of care important to the patient. The difference between aggressive medical intervention and comfort care was considered in light of the patient's goals of care. Ardis Hughs tells me the patient has made her wishes well known in the past and would not want her life prolonged with aggressive measures. Ardis Hughs states that Ms. Mccarrell's current state is not a quality of life she would want to continue living. Her goals at this time are to make the patient comfortable and free from suffering. We talked about transition to comfort measures in house and what that would entail inclusive of medications to control pain, dyspnea, agitation, nausea, itching, and hiccups. We discussed stopping all unnecessary measures such as blood draws, needle sticks, oxygen, antibiotics, CBGs/insulin, cardiac monitoring, and frequent vital signs. Ardis Hughs is agreeable to transition the patient to full comfort measures in house today; however, she would still like to complete urinalysis to assess for UTI and treat if indicated. We discussed that treating UTI is considered comfort measures and appropriate and we could support this. I also discussed that palliative wound care would also be appropriate and she did want to continue care for patient's wound.   Hospice services and philosophy was explained and offered. Ardis Hughs tells me her father/patient's husband was admitted with hospice for EOL care and she felt this was a very positive experience. She wishes for the patient to be treated in the same peaceful and comforting environment he was. She would like patient transferred to [facility].    Advance directives, concepts specific to code status, artificial feeding and hydration, and rehospitalization were considered and discussed. Ardis Hughs is patient's HCPOA  - she wants to continue DNR/DNI and is not interested in pursing agressive life prolonging interventions such as IVF or feeding tube, per the patient's wishes. She is supportive of decisions the patient made for herself as per discussions they have had in the past.    Education provided on current [place] EOL visitation policy - she expressed appreciation.  Visit also consisted of discussions dealing with the complex and emotionally intense issues of symptom management and palliative care in the setting of serious and life-threatening illness. Palliative care team will continue to support patient, patient's family, and medical team.  Discussed with patient/family the importance of continued conversation with each other and the medical providers regarding overall plan of care and treatment options, ensuring decisions are within the context of the patient's values and GOCs.    Questions and concerns were addressed. The patient/family was encouraged to call with questions and/or concerns. PMT card was provided.  Primary Decision Maker: HCPOA - daughter/Elizabeth Roth    SUMMARY OF RECOMMENDATIONS   . Initiated full comfort measures . Continue DNR/DNI as previously documented . [facility] when bed available - evaluation pending . TOC notified and consulted for: residential hospice referral, family requested [facility] . Added orders for EOL symptom management and to reflect full comfort measures, as well as discontinued orders that were not focused on comfort . Daugher would like for urinalysis to be completed, left order in place - antibiotics for UTI can be considered comfort measures and daugher wants treatment if indicated . No other labs, tests, or procedures. No feeding  tube. . Continue palliative wound care to pressure injury on sacrum . Unrestricted visitation orders were placed per current [place] EOL visitation policy  . Nursing to provide frequent assessments and administer PRN medications as clinically necessary to ensure EOL comfort . PMT will continue to follow and support holistically   Code Status/Advance Care Planning:  DNR  Palliative Prophylaxis:   Aspiration, Bowel Regimen, Delirium Protocol, Frequent Pain Assessment, Oral Care, Palliative Wound Care and Turn Reposition  Additional Recommendations (Limitations, Scope, Preferences):  Full Comfort Care  Psycho-social/Spiritual:   Desire for further Chaplaincy support:no Created space and opportunity for patient and family to express thoughts and feelings regarding patient's current medical situation.   Emotional support and therapeutic listening provided.  Prognosis:   < 2 weeks  Discharge Planning: Hospice facility      Primary Diagnoses: Present on Admission: **None**   I have reviewed the medical record, interviewed the patient and family, and examined the patient. The following aspects are pertinent.  Past Medical History:  Diagnosis Date  . Alzheimer disease ([place])   . Chronic back pain    Social History   Socioeconomic History  . Marital status: Married    Spouse name: Not on file  . Number of children: Not on file  . Years of education: Not on file  . Highest education level: Not on file  Occupational History  . Not on file  Tobacco Use  . Smoking status: Never Smoker  . Smokeless tobacco: Never Used  Substance and Sexual Activity  . Alcohol use: No  . Drug use: No  . Sexual activity: Not on file  Other Topics Concern  . Not on file  Social History Narrative  . Not on file   Social Determinants of Health   Financial Resource Strain: Not on file  Food Insecurity: Not on file  Transportation Needs: Not on file  Physical Activity:  Not on file  Stress: Not on file  Social Connections: Not on file   No family history on file. Scheduled Meds: Continuous Infusions: PRN Meds:. Medications Prior to Admission:  Prior to Admission medications   Medication Sig Start Date End Date Taking? Authorizing Provider  acetaminophen (TYLENOL) 325 MG tablet Take 650 mg by mouth every 6 (six) hours as needed for mild pain or fever.     [provider]  ascorbic acid (VITAMIN C) 500 MG tablet Take 1 tablet (500 mg total) by mouth daily. 05/17/20   Oretha Milch D, MD  aspirin EC 81 MG tablet Take 81 mg by mouth daily. Swallow whole.    [provider]  carbamide peroxide (DEBROX) 6.5 % OTIC solution Place 3 drops into both ears at bedtime.    [provider]  Cholecalciferol 125 MCG (5000 UT) TABS Take 5,000 Units by mouth daily.  [provider]  collagenase (SANTYL) ointment Apply topically daily. 05/17/20   Oretha Milch D, MD  donepezil (ARICEPT) 10 MG tablet Take 10 mg by mouth at bedtime.    [provider]  feeding supplement (ENSURE ENLIVE / ENSURE PLUS) LIQD Take 237 mLs by mouth 2 (two) times daily between meals. 05/16/20   Desiree Hane, MD  furosemide (LASIX) 20 MG tablet Take 1 tablet (20 mg total) by mouth as needed for edema. 05/16/20   Desiree Hane, MD  hydroxypropyl methylcellulose / hypromellose (ISOPTO TEARS / GONIOVISC) 2.5 % ophthalmic solution Place 2 drops into both eyes.    [provider]  melatonin 1 MG TABS tablet Take 3 mg by mouth at bedtime.    [provider]  memantine (NAMENDA) 10 MG tablet Take 10 mg by mouth daily.    [provider]  Multiple Vitamin (MULTIVITAMIN WITH MINERALS) TABS tablet Take 1 tablet by mouth daily. 05/17/20   Desiree Hane, MD  Nutritional Supplements (,FEEDING SUPPLEMENT, PROSOURCE PLUS) liquid Take 30 mLs by mouth 2 (two) times daily between meals. 05/17/20   Desiree Hane, MD  polyvinyl  alcohol (LIQUIFILM TEARS) 1.4 % ophthalmic solution Place 1 drop into both eyes 2 (two) times daily. 05/16/20   Oretha Milch D, MD  polyvinyl alcohol (LIQUIFILM TEARS) 1.4 % ophthalmic solution Place 1 drop into both eyes as needed for dry eyes. 05/16/20   Oretha Milch D, MD  risperiDONE (RISPERDAL) 0.5 MG tablet Take 1.5 tablets (0.75 mg total) by mouth at bedtime. 05/16/20   Desiree Hane, MD  sertraline (ZOLOFT) 50 MG tablet Take 2.5 tablets (125 mg total) by mouth daily. 05/16/20   Desiree Hane, MD  Skin Protectants, Misc. (EUCERIN) cream Apply 1 application topically every evening. Apply to both feet    [provider]  vitamin B-12 (CYANOCOBALAMIN) 500 MCG tablet Take 500 mcg by mouth daily.    [provider]  zinc gluconate 50 MG tablet Take 50 mg by mouth daily.    [provider]  zinc sulfate 220 (50 Zn) MG capsule Take 1 capsule (220 mg total) by mouth daily. 05/17/20   Desiree Hane, MD   Allergies  Allergen Reactions  . Iodinated Diagnostic Agents Swelling and Other (See Comments)    Face and lips swell  . Sulfa Antibiotics Hives and Swelling  . Hydrocodone Rash   Review of Systems  Unable to perform ROS: Acuity of condition    Physical Exam Vitals and nursing note reviewed.  Constitutional:      General: She is not in acute distress.    Appearance: She is ill-appearing.  Pulmonary:     Effort: No respiratory distress.  Skin:    General: Skin is cool and dry.  Neurological:     Mental Status: She is lethargic, disoriented and confused.     Motor: Weakness present.  Psychiatric:        Attention and Perception: She perceives visual hallucinations.        Speech: She is noncommunicative.        Cognition and Memory: Cognition is impaired. Memory is impaired.     Vital Signs: BP 124/64   Pulse 71   Temp 98.7 F (37.1 C) (Rectal)   Resp 17   SpO2 99%          SpO2: SpO2: 99 % O2 Device:SpO2: 99 % O2 Flow Rate: .O2  Flow Rate (L/min): 2 L/min  IO: Intake/output  summary: No intake or output data in the 24 hours ending 08/07/20 1619  LBM:   Baseline Weight:   Most recent weight:       Palliative Assessment/Data: PPS 10%     Time In: 1630 Time Out: 1750 Time Total: 80 minutes  Greater than 50%  of this time was spent counseling and coordinating care related to the above assessment and plan.  Signed by: Lin Landsman, NP   Please contact Palliative Medicine Team phone at [phone] for questions and concerns.  For individual provider: See Shea Evans

## 2020-08-07 NOTE — Progress Notes (Incomplete)
Received report from [employer].

## 2020-08-07 NOTE — ED Triage Notes (Signed)
Per ems pt was brought it for AMS. States they are not forsure of pts baseline but facility said pt was being fed lunch around 12:30. Daughter came at 2 and said pt didn't seem normal. Pt has hx of dementia. Per ems they where unable to do NIH. Pt responds to painful stimuli.

## 2020-08-08 DIAGNOSIS — Z515 Encounter for palliative care: Secondary | ICD-10-CM

## 2020-08-08 NOTE — TOC Transition Note (Signed)
Transition of Care [facility]) - CM/SW Discharge Note   Patient Details  Name: Melinda Love MRN: [id number] Date of Birth: 24-Jun-1933  Transition of Care [facility]) CM/SW Contact:  Durenda Guthrie, RN Phone Number: 08/08/2020, 11:42 AM   Clinical Narrative:    Patient will DC to:  [facility] Anticipated DC date: 08/08/20 Family notified: daughter Transport by:  PTAR 12:30 p.m.    Patient is ready for discharge to [facility]. Paulita Cradle, Hospice Liaison confirmed that bed is available and has spoken with patient's daughter. Bedside RN Alyse Low will call report to [phone]. PTAR has been scheduled for 12:30p pickup.       Patient Goals and CMS Choice        Discharge Placement                       Discharge Plan and Services                                     Social Determinants of Health (SDOH) Interventions     Readmission Risk Interventions No flowsheet data found.

## 2020-08-08 NOTE — Discharge Summary (Signed)
Physician Discharge Summary  Melinda Love KDX:[id number] DOB: 17-Jul-1933 DOA: 08/07/2020  PCP: Patient, No Pcp Per  Admit date: 08/07/2020 Discharge date: 08/08/2020  Recommendations for Outpatient Follow-up:  1. Discharge to residential hospice for end of life care.   Follow-up Information    [facility]. Follow up.   Contact information: [address] [place] [address] [phone]              Discharge Diagnoses: Principal diagnosis is #1 1. End stage dementia with functional decline 2. Stage IV sacral decubitus ulcer  Discharge Condition: Terminal  Disposition: Residential hospice for end of life care.  Diet recommendation: Regular diet for pleasure feeds.  There were no vitals filed for this visit.  History of present illness:  Melinda Love is a 87 y.o. female with medical history significant for Alzheimer's dementia and stage IV sacral decubitus ulcer who is brought to the ED from [facility] rehab facility for evaluation of altered mental status and functional decline.  Patient is unable to provide any history due to underlying dementia therefore entirety of history is obtained from EDP, chart review, and family at bedside.  Patient had recent hospitalization at [facility] in December 2021 for stage IV sacral decubitus ulcer management.  [facility] and now currently resides at [facility] rehab facility.  Her daughter states that patient has had progressive functional decline over the last few weeks however today it significantly diminished mental status and ability to interact or communicate needs.  Due to significant changes brought to the ED for further evaluation.  While in the ED after discussions with the emergency physician and palliative care, decision was made to focus on comfort care with anticipated transfer to hospice of [facility] when bed is available.  Family would like to  evaluate for UTI and continue palliative wound care as well as using antibiotics if indicated.  ED Course:  Initial vitals showed BP 124/64, pulse 77, RR 17, temp 98.7 F, SPO2 99% on 2 L supplemental O2 via .  Labs show WBC 10.6, hemoglobin 13.0, platelets 282,000, sodium 135, potassium 4.8, bicarb 25, BUN 18, creatinine 1.03, serum glucose 83, LFTs within normal limits, ammonia 30.  SARS-CoV-2 PCR is ordered and pending.  Urinalysis ordered and awaiting collection.  Portable chest x-ray is negative for acute cardiopulmonary abnormality.  Prior right rotator cuff surgical changes noted.  Palliative care were consulted in the ED and after ED physician and palliative care discussion with family the decision was made to only pursue comfort care measures with plan for transfer to inpatient hospice.  Currently no bed available at [facility] which is preferred facility therefore the hospitalist service was consulted to admit for further care until hospice bed available.  Hospital Course:  Pt admitted on comfort care. Very poor urinary output. Pt appears comfortable on comfort care measures. Dtr had asked about getting UA due to "dark urine". I explained that urine will be dark when patient is not taking much fluid in. No fever, no white count. I have also explained why antibiotics are not consistent with comfort care. Daughter and grandchildren have expressed understanding.  Today's assessment: S: Pt lying comfortably with eyes closed. O: Vitals:  Vitals:   08/07/20 2112 08/08/20 0658  BP: (!) 125/58 119/62  Pulse: 72 77  Resp: 19 18  Temp: 98.4 F (36.9 C) 97.6 F (36.4 C)  SpO2: 100% 97%    Exam:  Constitutional:  . The patient is sleeping. No acute distress. Not awakened. Respiratory:  . No increased work of breathing. . No wheezes, rales, or rhonchi . No tactile fremitus Cardiovascular:  . Regular rate and rhythm . No murmurs, ectopy, or gallups. . No  lateral PMI. No thrills. Abdomen:  . Abdomen is soft, non-tender, non-distended . No hernias, masses, or organomegaly . Normoactive bowel sounds.  Musculoskeletal:  . No cyanosis, clubbing, or edema Skin:  . No rashes, lesions, ulcers . palpation of skin: no induration or nodules  Discharge Instructions  Discharge Instructions    Call MD for:  difficulty breathing, headache or visual disturbances   Complete by: As directed    Call MD for:  persistant nausea and vomiting   Complete by: As directed    Call MD for:  severe uncontrolled pain   Complete by: As directed    Diet general   Complete by: As directed    Pleasure feeds   Discharge instructions   Complete by: As directed    Discharge to residential hospice facility for end of life care.   No wound care   Complete by: As directed      Allergies as of 08/08/2020      Reactions   Iodinated Diagnostic Agents Swelling, Other (See Comments)   Face and lips swell   Sulfa Antibiotics Hives, Swelling   Chloromycetin [chloramphenicol] Other (See Comments)   "ALLERGIC," per MAR   Penicillins Other (See Comments)   "ALLERGIC," per MAR   Hydrocodone Rash      Medication List    STOP taking these medications   (feeding supplement) PROSource Plus liquid   amantadine 100 MG capsule Commonly known as: SYMMETREL   ascorbic acid 500 MG tablet Commonly known as: VITAMIN C   aspirin EC 81 MG tablet   collagenase ointment Commonly known as: SANTYL   Dialyvite Vitamin D3 Max 1.25 MG (50000 UT) Tabs Generic drug: Cholecalciferol   donepezil 10 MG tablet Commonly known as: ARICEPT   Effer-K 20 MEQ Tbef Generic drug: Potassium Bicarb-Citric Acid   feeding supplement Liqd   Fish Oil 600 MG Caps   furosemide 20 MG tablet Commonly known as: LASIX   memantine 10 MG tablet Commonly known as: NAMENDA   modafinil 100 MG tablet Commonly known as: PROVIGIL   multivitamin with minerals Tabs tablet   polyvinyl alcohol  1.4 % ophthalmic solution Commonly known as: LIQUIFILM TEARS   risperiDONE 0.5 MG tablet Commonly known as: RISPERDAL   sertraline 100 MG tablet Commonly known as: ZOLOFT   sertraline 25 MG tablet Commonly known as: ZOLOFT   sertraline 50 MG tablet Commonly known as: ZOLOFT   Tab-A-Vite/Beta Carotene Tabs   vitamin B-12 500 MCG tablet Commonly known as: CYANOCOBALAMIN   zinc sulfate 220 (50 Zn) MG capsule      Allergies  Allergen Reactions  . Iodinated Diagnostic Agents Swelling and Other (See Comments)    Face and lips swell  . Sulfa Antibiotics Hives and Swelling  . Chloromycetin [Chloramphenicol] Other (See Comments)    "ALLERGIC," per MAR  . Penicillins Other (See Comments)    "ALLERGIC," per MAR  . Hydrocodone Rash    The results of significant diagnostics from this hospitalization (including imaging, microbiology, ancillary and laboratory) are listed below for reference.    Significant Diagnostic Studies: DG Chest Portable 1 View  Result Date: 08/07/2020 CLINICAL DATA:  Altered mental status EXAM: PORTABLE CHEST 1 VIEW COMPARISON:  June 03, 2020 FINDINGS:  The cardiomediastinal silhouette is unchanged in contour.Atherosclerotic calcifications of the aorta. No pleural effusion. No pneumothorax. No acute pleuroparenchymal abnormality. Visualized abdomen is unremarkable. Multilevel degenerative changes of the thoracic spine. Status post RIGHT rotator cuff surgery. IMPRESSION: No acute cardiopulmonary abnormality. Electronically Signed   By: Meda Klinefelter MD   On: 08/07/2020 16:22    Microbiology: Recent Results (from the past 240 hour(s))  SARS CORONAVIRUS 2 (TAT 6-24 HRS) Nasopharyngeal Nasopharyngeal Swab     Status: None   Collection Time: 08/07/20  6:51 PM   Specimen: Nasopharyngeal Swab  Result Value Ref Range Status   SARS Coronavirus 2 NEGATIVE NEGATIVE Final    Comment: (NOTE) SARS-CoV-2 target nucleic acids are NOT DETECTED.  The SARS-CoV-2 RNA  is generally detectable in upper and lower respiratory specimens during the acute phase of infection. Negative results do not preclude SARS-CoV-2 infection, do not rule out co-infections with other pathogens, and should not be used as the sole basis for treatment or other patient management decisions. Negative results must be combined with clinical observations, patient history, and epidemiological information. The expected result is Negative.  Fact Sheet for Patients: HairSlick.no  Fact Sheet for Healthcare Providers: quierodirigir.com  This test is not yet approved or cleared by the [place] FDA and  has been authorized for detection and/or diagnosis of SARS-CoV-2 by FDA under an Emergency Use Authorization (EUA). This EUA will remain  in effect (meaning this test can be used) for the duration of the COVID-19 declaration under Se ction 564(b)(1) of the Act, 21 U.S.C. section 360bbb-3(b)(1), unless the authorization is terminated or revoked sooner.  Performed at [facility], [address] [place], [place] [address]      Labs: Basic Metabolic Panel: Recent Labs  Lab 08/07/20 1540  NA 135  K 4.8  CL 98  CO2 25  GLUCOSE 83  BUN 18  CREATININE 1.03*  CALCIUM 10.0   Liver Function Tests: Recent Labs  Lab 08/07/20 1540  AST 27  ALT 20  ALKPHOS 99  BILITOT 1.0  PROT 7.1  ALBUMIN 2.8*   No results for input(s): LIPASE, AMYLASE in the last 168 hours. Recent Labs  Lab 08/07/20 1541  AMMONIA 30   CBC: Recent Labs  Lab 08/07/20 1540  WBC 10.6*  NEUTROABS 8.3*  HGB 13.0  HCT 40.8  MCV 87.7  PLT 282   Cardiac Enzymes: No results for input(s): CKTOTAL, CKMB, CKMBINDEX, TROPONINI in the last 168 hours. BNP: BNP (last 3 results) No results for input(s): BNP in the last 8760 hours.  ProBNP (last 3 results) No results for input(s): PROBNP in the last 8760 hours.  CBG: No results for  input(s): GLUCAP in the last 168 hours.  Principal Problem:   End of life care Active Problems:   Sacral decubitus ulcer, stage IV (HCC)   Alzheimer's dementia (HCC)   Time coordinating discharge: 38 minutes.  Signed:        Analeise Mccleery, DO [facility]  08/08/2020, 11:14 AM

## 2020-08-08 NOTE — Consult Note (Signed)
WOC Nurse Consult Note: Reason for Consult: Palliative wound care plan for Stage 4 pressure injury to sacrum.. Last seen by our service on 05/16/20. Patient for transfer to Residential [facility]ospice Wound type:pressure Pressure Injury POA: Yes Measurement:Not measured today, last measurement was 4cm x 3cm. Nursing notes indicate wound slightly larger this admission (7x6x6) Wound bed:Not seen today Drainage (amount, consistency, odor) Small serous to seosanguinous Periwound:friable Dressing procedure/placement/frequency: A saline moistened gauze dressing is appropriate at this stage of life as wound healing is not the goal.Turning as able is appropriate and wound care once or twice daily as comfort allows is adequate.  WOC nursing team will not follow, but will remain available to this patient, the nursing and medical teams.  Please re-consult if needed. Thanks, Ladona Mow, MSN, RN, GNP, [place]  Pager# [phone]

## 2020-10-02 DEATH — deceased

## 2021-07-22 IMAGING — CT CT HEAD W/O CM
3 series · 16 of 47 positions shown, 19 images · non-contrast
Comparison: Prior noncontrast head CT examination 11/22/2019 and
earlier.

CLINICAL DATA: Mental status change, unknown cause. Additional
provided: Patient admitted with wound infection, dementia.

EXAM:
CT HEAD WITHOUT CONTRAST
TECHNIQUE: Contiguous axial images were obtained from the base of the skull
through the vertex without intravenous contrast.

[Series 3: head wo · axial · 0.47mm/px · z∈[-153,-28]mm · 10 of 31 slices shown, 13 images]
[im 3/31  brain]
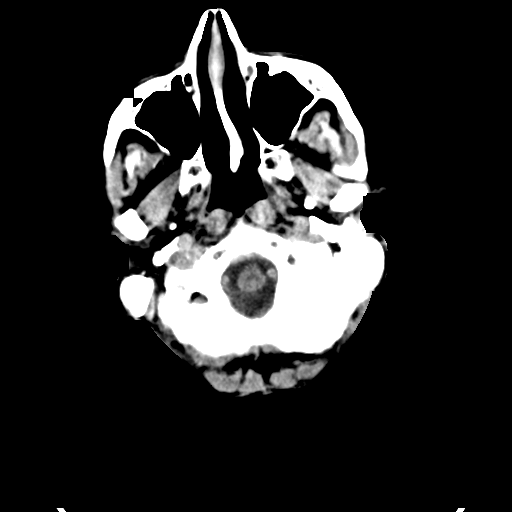
[im 3/31  bone]
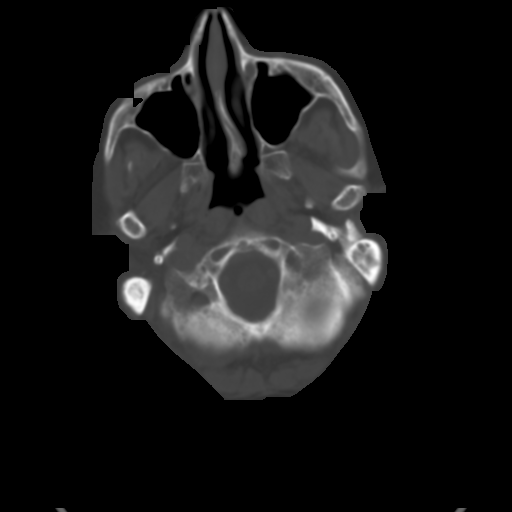
[im 6/31  brain]
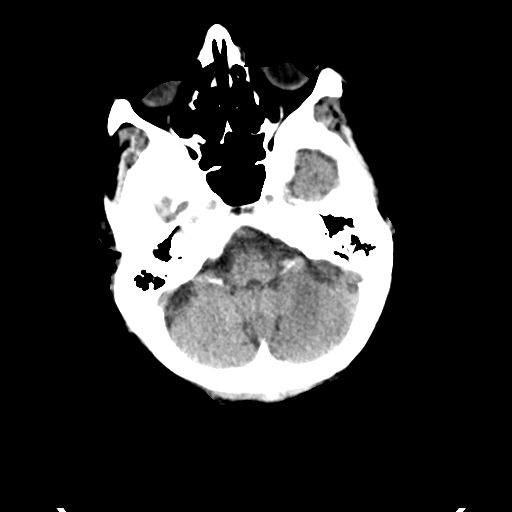
[im 9/31  brain]
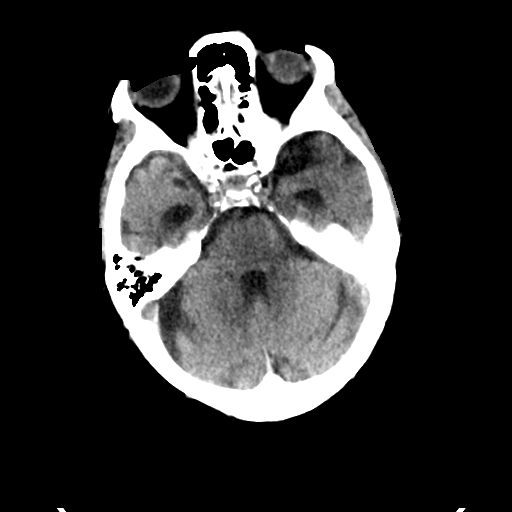
[im 11/31  brain]
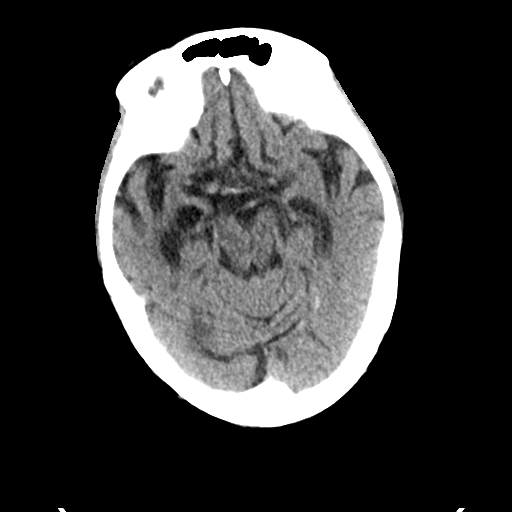
[im 14/31  brain]
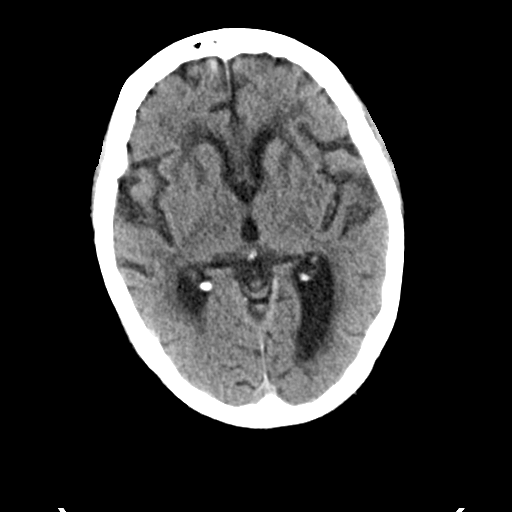
[im 14/31  bone]
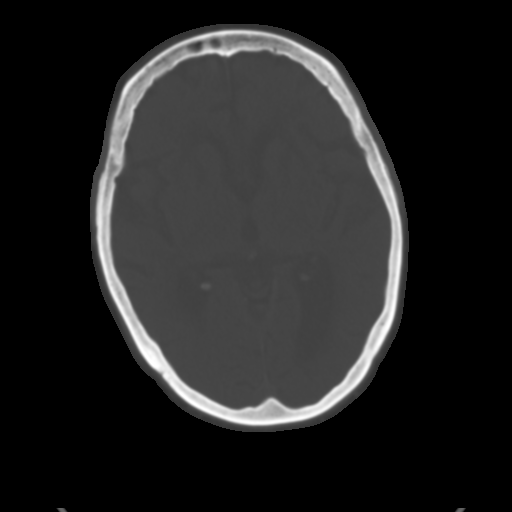
[im 17/31  brain]
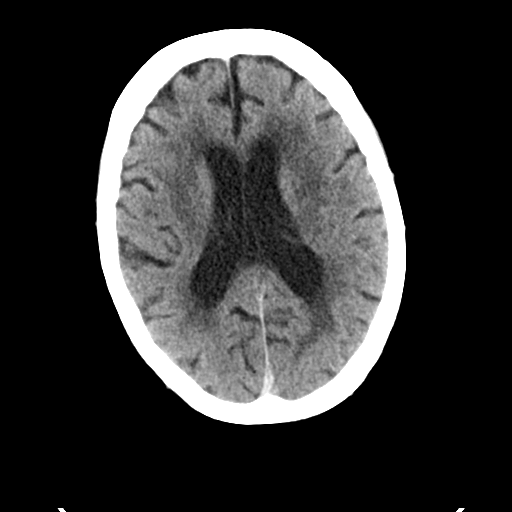
[im 20/31  brain]
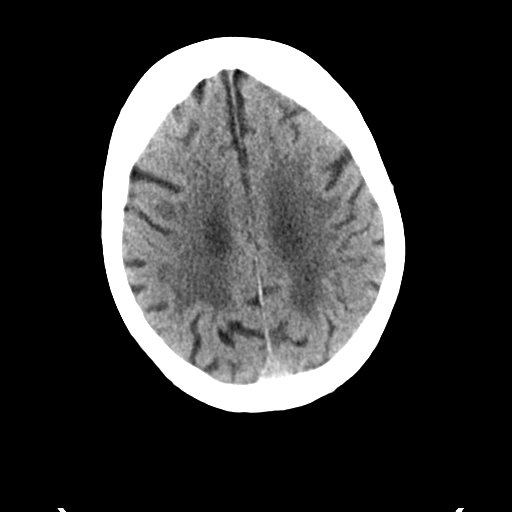
[im 23/31  brain]
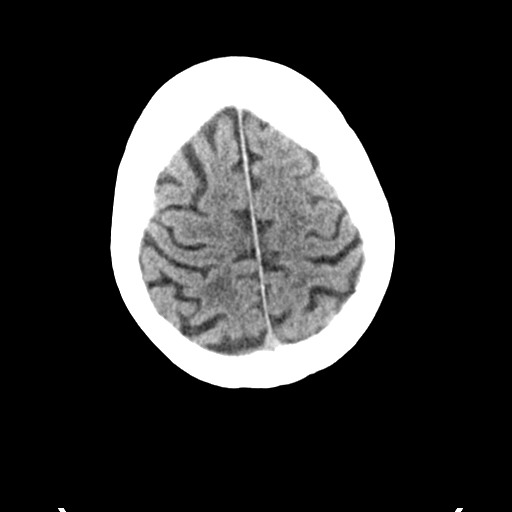
[im 25/31  brain]
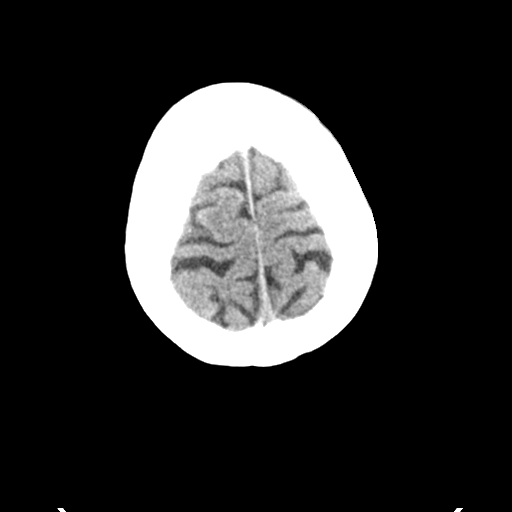
[im 25/31  bone]
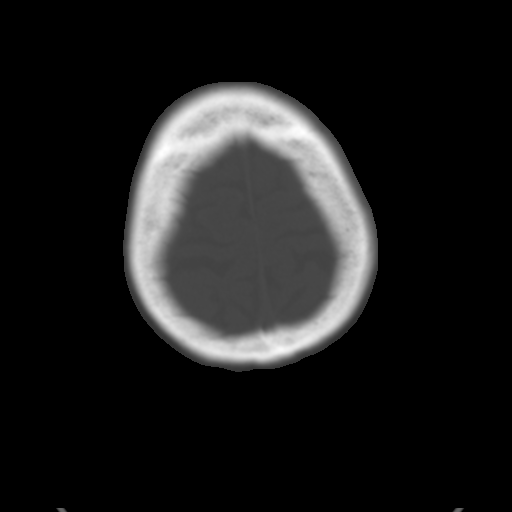
[im 28/31  brain]
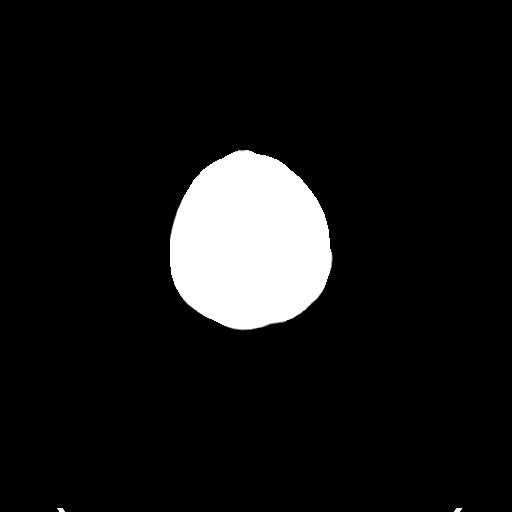

[Series 4: coronal soft tissue · coronal · 0.31mm/px · 3 of 69 slices shown]
[im 23/69  brain]
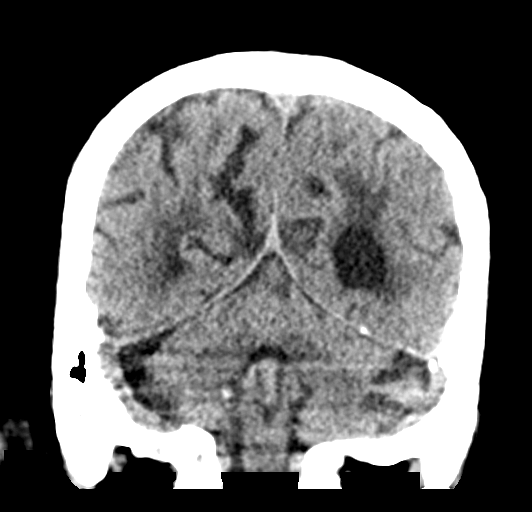
[im 31/69  brain]
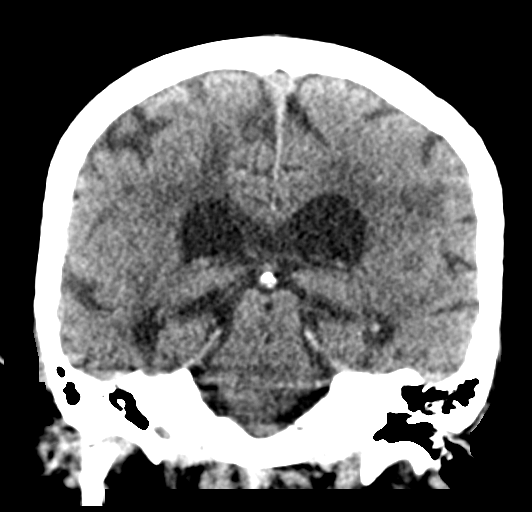
[im 38/69  brain]
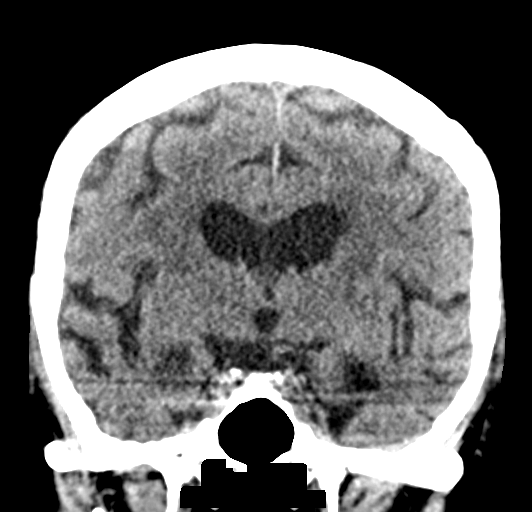

[Series 5: sagittal soft tissue · sagittal · 0.34mm/px · 3 of 55 slices shown]
[im 19/55  brain]
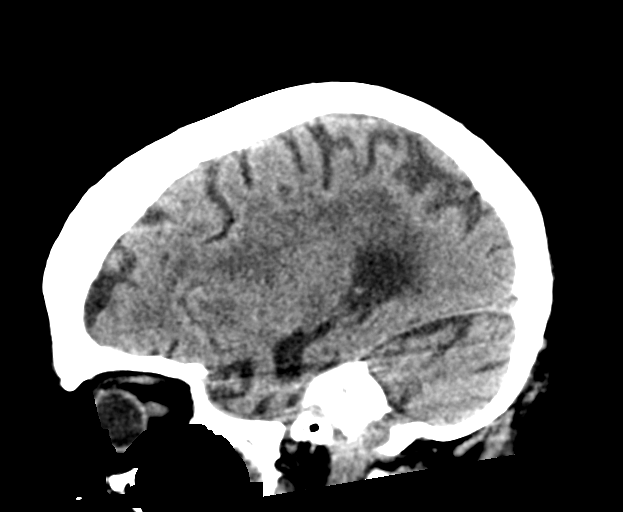
[im 28/55  brain]
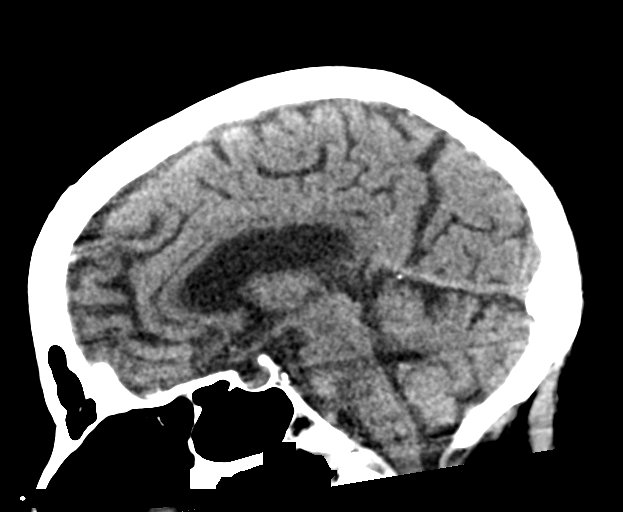
[im 37/55  brain]
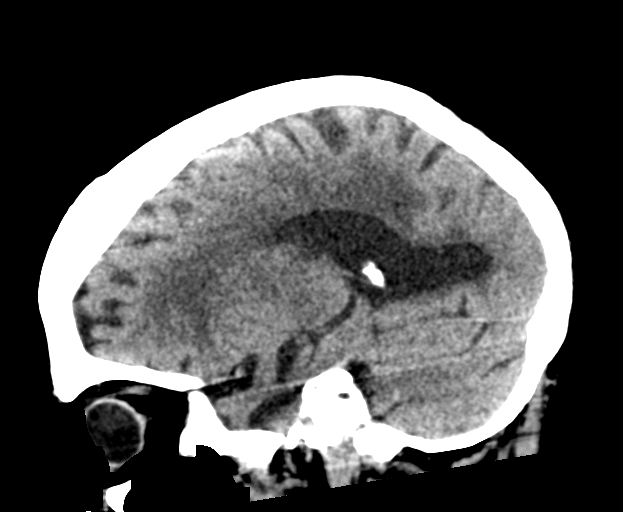

[16 of 47 positions shown; findings below may reference images not displayed]

FINDINGS: Brain:

Mild-to-moderate cerebral atrophy with a medial temporal lobe
predominance.

Advanced ill-defined hypoattenuation within the cerebral white
matter is nonspecific, but compatible chronic small vessel ischemic
disease.

There is no acute intracranial hemorrhage.

No demarcated cortical infarct.

No extra-axial fluid collection.

No evidence of intracranial mass.

No midline shift.

Vascular: No hyperdense vessel.  Atherosclerotic calcifications.

Skull: Normal. Negative for fracture or focal lesion.

Sinuses/Orbits: Visualized orbits show no acute finding. Minimal
ethmoid sinus mucosal thickening.

Other: Trace bilateral mastoid effusions.
IMPRESSION: No evidence of acute intracranial abnormality.

Mild/moderate cerebral atrophy with a medial temporal lobe
predominance.

Advanced chronic small vessel ischemic disease.

Small bilateral mastoid effusions.

## 2021-08-17 IMAGING — DX DG CHEST 1V PORT
1 series · 1 of 1 positions shown · non-contrast
Comparison: 05/07/2020

CLINICAL DATA: Leukocytosis.

EXAM:
PORTABLE CHEST 1 VIEW

[chest ap]
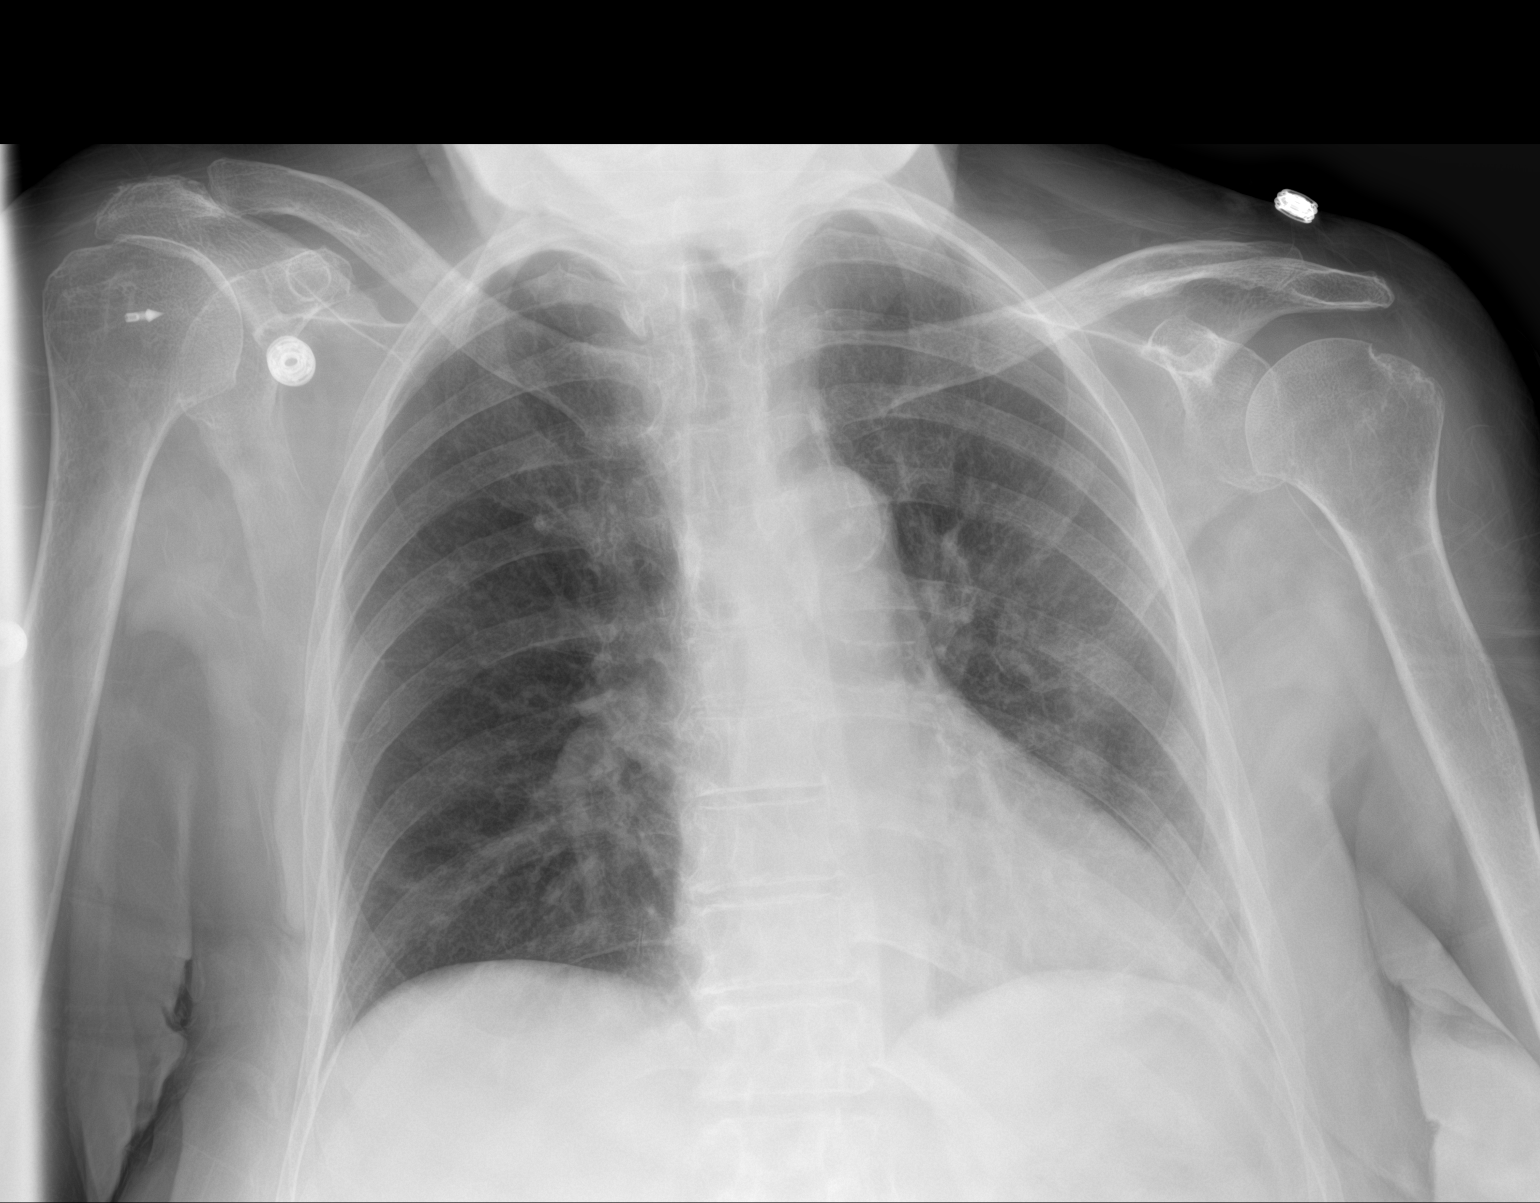

[1 of 1 positions shown; findings below may reference images not displayed]

FINDINGS: Cardiac silhouette is normal in size. Normal mediastinal and hilar
contours.

There is hazy opacity in the left mid and lower lung. Remainder of
the lungs is clear.

No convincing pleural effusion and no pneumothorax.

Skeletal structures are grossly intact.
IMPRESSION: 1. Hazy airspace opacities in the left mid to lower lung suggests
multifocal infection. Consider atypical and viral etiologies
including COVID 19.
